# Patient Record
Sex: Female | Born: 1965 | Race: Black or African American | Hispanic: No | Marital: Single | Smoking: Never smoker
Health system: Southern US, Community
[De-identification: ages and names within clinical notes are randomized; demographics above are authoritative.]

## PROBLEM LIST (undated history)

## (undated) DIAGNOSIS — J302 Other seasonal allergic rhinitis: Secondary | ICD-10-CM

## (undated) DIAGNOSIS — E7800 Pure hypercholesterolemia, unspecified: Secondary | ICD-10-CM

## (undated) HISTORY — PX: HERNIA REPAIR: SHX51

---

## 2001-03-02 ENCOUNTER — Emergency Department (HOSPITAL_COMMUNITY): Admission: EM | Admit: 2001-03-02 | Discharge: 2001-03-03 | Payer: Self-pay | Admitting: *Deleted

## 2002-04-21 ENCOUNTER — Emergency Department (HOSPITAL_COMMUNITY): Admission: EM | Admit: 2002-04-21 | Discharge: 2002-04-21 | Payer: Self-pay | Admitting: Emergency Medicine

## 2002-10-13 ENCOUNTER — Emergency Department (HOSPITAL_COMMUNITY): Admission: EM | Admit: 2002-10-13 | Discharge: 2002-10-13 | Payer: Self-pay | Admitting: *Deleted

## 2005-04-28 ENCOUNTER — Emergency Department (HOSPITAL_COMMUNITY): Admission: EM | Admit: 2005-04-28 | Discharge: 2005-04-28 | Payer: Self-pay | Admitting: Emergency Medicine

## 2005-05-05 ENCOUNTER — Emergency Department (HOSPITAL_COMMUNITY): Admission: EM | Admit: 2005-05-05 | Discharge: 2005-05-05 | Payer: Self-pay | Admitting: Emergency Medicine

## 2005-07-19 ENCOUNTER — Emergency Department (HOSPITAL_COMMUNITY): Admission: EM | Admit: 2005-07-19 | Discharge: 2005-07-19 | Payer: Self-pay | Admitting: *Deleted

## 2006-05-05 ENCOUNTER — Emergency Department (HOSPITAL_COMMUNITY): Admission: EM | Admit: 2006-05-05 | Discharge: 2006-05-05 | Payer: Self-pay | Admitting: Emergency Medicine

## 2008-09-14 ENCOUNTER — Emergency Department (HOSPITAL_COMMUNITY): Admission: EM | Admit: 2008-09-14 | Discharge: 2008-09-15 | Payer: Self-pay | Admitting: Emergency Medicine

## 2009-11-08 ENCOUNTER — Ambulatory Visit (HOSPITAL_COMMUNITY): Admission: RE | Admit: 2009-11-08 | Discharge: 2009-11-08 | Payer: Self-pay | Admitting: Family Medicine

## 2010-10-13 ENCOUNTER — Emergency Department (HOSPITAL_COMMUNITY)
Admission: EM | Admit: 2010-10-13 | Discharge: 2010-10-13 | Payer: Self-pay | Source: Home / Self Care | Admitting: Emergency Medicine

## 2010-11-11 ENCOUNTER — Other Ambulatory Visit: Payer: Self-pay

## 2010-11-11 DIAGNOSIS — Z139 Encounter for screening, unspecified: Secondary | ICD-10-CM

## 2010-11-14 ENCOUNTER — Ambulatory Visit (HOSPITAL_COMMUNITY): Payer: Self-pay

## 2010-11-18 ENCOUNTER — Encounter (HOSPITAL_COMMUNITY): Payer: Self-pay

## 2010-11-22 ENCOUNTER — Ambulatory Visit (HOSPITAL_COMMUNITY)
Admission: RE | Admit: 2010-11-22 | Discharge: 2010-11-22 | Disposition: A | Discharge status: Home / Self Care | Payer: Self-pay | Source: Ambulatory Visit

## 2010-11-22 DIAGNOSIS — Z139 Encounter for screening, unspecified: Secondary | ICD-10-CM

## 2010-11-26 ENCOUNTER — Other Ambulatory Visit: Payer: Self-pay | Admitting: *Deleted

## 2010-11-26 DIAGNOSIS — R928 Other abnormal and inconclusive findings on diagnostic imaging of breast: Secondary | ICD-10-CM

## 2010-12-25 ENCOUNTER — Other Ambulatory Visit (HOSPITAL_COMMUNITY): Payer: Self-pay | Admitting: Family Medicine

## 2010-12-25 ENCOUNTER — Ambulatory Visit (HOSPITAL_COMMUNITY)
Admission: RE | Admit: 2010-12-25 | Discharge: 2010-12-25 | Disposition: A | Discharge status: Home / Self Care | Payer: Self-pay | Source: Ambulatory Visit | Attending: *Deleted | Admitting: *Deleted

## 2010-12-25 DIAGNOSIS — R928 Other abnormal and inconclusive findings on diagnostic imaging of breast: Secondary | ICD-10-CM

## 2011-10-24 ENCOUNTER — Encounter (HOSPITAL_COMMUNITY): Payer: Self-pay

## 2011-10-24 ENCOUNTER — Emergency Department (HOSPITAL_COMMUNITY)
Admission: EM | Admit: 2011-10-24 | Discharge: 2011-10-24 | Disposition: A | Discharge status: Home / Self Care | Payer: Self-pay | Attending: Emergency Medicine | Admitting: Emergency Medicine

## 2011-10-24 DIAGNOSIS — H9209 Otalgia, unspecified ear: Secondary | ICD-10-CM | POA: Insufficient documentation

## 2011-10-24 DIAGNOSIS — H6120 Impacted cerumen, unspecified ear: Secondary | ICD-10-CM | POA: Insufficient documentation

## 2011-10-24 DIAGNOSIS — H6690 Otitis media, unspecified, unspecified ear: Secondary | ICD-10-CM | POA: Insufficient documentation

## 2011-10-24 MED ORDER — AMOXICILLIN 500 MG PO CAPS: 500.0000 mg | ORAL_CAPSULE | Freq: Three times a day (TID) | ORAL | Status: AC

## 2011-10-24 MED ORDER — ANTIPYRINE-BENZOCAINE 5.4-1.4 % OT SOLN
3.0000 [drp] | Freq: Once | OTIC | Status: AC
  Administered 2011-10-24: 3 [drp] via OTIC
  Filled 2011-10-24: qty 10

## 2011-10-24 NOTE — ED Notes (Signed)
Pt a/ox4. Resp even and unlabored. NAD at this time. D/C instructions reviewed with pt. Pt verbalized understanding. Pt ambulated to lobby with steady gate.  

## 2011-10-24 NOTE — ED Notes (Signed)
Bilateral ear irrigation performed with warm saline. Large amount of wax removed from both ears.

## 2011-10-24 NOTE — ED Notes (Signed)
Pt presents with bilateral ear pain "deep down in the ear" since last night. No drainage or swelling noted. Pt has no hearing deficit. NAD at this time.

## 2011-10-24 NOTE — ED Provider Notes (Signed)
History     CSN: [id number]  Arrival date & time 10/24/11  1243   First MD Initiated Contact with Patient 10/24/11 1302      Chief Complaint  Patient presents with  . Otalgia    (Consider location/radiation/quality/duration/timing/severity/associated sxs/prior treatment) Patient is a 46 y.o. female presenting with ear pain. The history is provided by the patient. No language interpreter was used.  Otalgia This is a new problem. The current episode started yesterday. There is pain in both ears. The problem occurs constantly. The problem has not changed since onset.There has been no fever. The pain is mild. Pertinent negatives include no ear discharge, no headaches, no hearing loss, no rhinorrhea, no sore throat, no vomiting, no neck pain, no cough and no rash.    History reviewed. No pertinent past medical history.  History reviewed. No pertinent past surgical history.  No family history on file.  History  Substance Use Topics  . Smoking status: Never Smoker   . Smokeless tobacco: Not on file  . Alcohol Use: No    OB History    Grav Para Term Preterm Abortions TAB SAB Ect Mult Living                  Review of Systems  HENT: Positive for ear pain. Negative for hearing loss, congestion, sore throat, facial swelling, rhinorrhea, neck pain, tinnitus and ear discharge.   Eyes: Negative for visual disturbance.  Respiratory: Negative for cough.   Gastrointestinal: Negative for vomiting.  Skin: Negative for rash.  Neurological: Negative for dizziness, light-headedness and headaches.  All other systems reviewed and are negative.    Allergies  Review of patient's allergies indicates no known allergies.  Home Medications   Current Outpatient Rx  Name Route Sig Dispense Refill  . CETIRIZINE HCL 10 MG PO TABS Oral Take 10 mg by mouth daily.    Marland Kitchen FERROUS SULFATE 325 (65 FE) MG PO TABS Oral Take 325 mg by mouth daily.    . IBUPROFEN 200 MG PO TABS Oral Take 400 mg by mouth  every 6 (six) hours as needed. For pain    . OMEGA-3-ACID ETHYL ESTERS 1 G PO CAPS Oral Take 1 g by mouth 3 (three) times daily.      BP 124/77  Pulse 73  Temp(Src) 97.9 F (36.6 C) (Oral)  Resp 14  Ht 5\' 5"  (1.651 m)  Wt 177 lb (80.287 kg)  BMI 29.45 kg/m2  SpO2 100%  LMP 10/24/2011  Physical Exam  Nursing note and vitals reviewed. Constitutional: She is oriented to person, place, and time. She appears well-developed and well-nourished. No distress.  HENT:  Head: Normocephalic and atraumatic.  Right Ear: Tympanic membrane normal.  Left Ear: No drainage, swelling or tenderness. Tympanic membrane is erythematous.  Mouth/Throat: Uvula is midline, oropharynx is clear and moist and mucous membranes are normal.       Moderate amt of cerumen to bilateral ear canals. Portion of the TM visualized on left, with erythema of the left TM.  No perf  Neck: Normal range of motion. Neck supple. No thyromegaly present.  Cardiovascular: Normal rate, regular rhythm and normal heart sounds.   Pulmonary/Chest: Effort normal and breath sounds normal. No respiratory distress.  Musculoskeletal: Normal range of motion.  Lymphadenopathy:    She has no cervical adenopathy.  Neurological: She is alert and oriented to person, place, and time. No cranial nerve deficit. She exhibits normal muscle tone. Coordination normal.  Skin: Skin is warm and  dry.    ED Course  Procedures (including critical care time)       MDM     Moderate amt of cerumen to bilateral ears.  Left OM.  Cerumen irrigated from the left ear and moderate amt of cerumen removed from the right ear by me using an ear curette.  Pt tolerated well.    Pt feels improved after observation and/or treatment in ED.     Medical screening examination/treatment/procedure(s) were performed by non-physician practitioner and as supervising physician I was immediately available for consultation/collaboration. Osvaldo Human, M.D.    Tammy  L. [place], [place] 10/26/11 2251  Carleene Cooper III, MD 10/29/11 [phone]

## 2011-10-24 NOTE — ED Notes (Signed)
C/o bil ear pain since yesterday. nad noted. Pt a/o x3. Ambulated to triage without difficulty.

## 2012-04-13 ENCOUNTER — Other Ambulatory Visit (HOSPITAL_COMMUNITY): Payer: Self-pay | Admitting: Nurse Practitioner

## 2012-04-13 DIAGNOSIS — Z139 Encounter for screening, unspecified: Secondary | ICD-10-CM

## 2012-04-20 ENCOUNTER — Ambulatory Visit (HOSPITAL_COMMUNITY)
Admission: RE | Admit: 2012-04-20 | Discharge: 2012-04-20 | Disposition: A | Discharge status: Home / Self Care | Payer: PRIVATE HEALTH INSURANCE | Source: Ambulatory Visit | Attending: Nurse Practitioner | Admitting: Nurse Practitioner

## 2012-04-20 DIAGNOSIS — Z139 Encounter for screening, unspecified: Secondary | ICD-10-CM

## 2012-04-20 DIAGNOSIS — Z1231 Encounter for screening mammogram for malignant neoplasm of breast: Secondary | ICD-10-CM | POA: Insufficient documentation

## 2012-04-26 ENCOUNTER — Other Ambulatory Visit (HOSPITAL_COMMUNITY): Payer: Self-pay | Admitting: Nurse Practitioner

## 2012-04-26 DIAGNOSIS — IMO0002 Reserved for concepts with insufficient information to code with codable children: Secondary | ICD-10-CM

## 2012-04-28 ENCOUNTER — Encounter (HOSPITAL_COMMUNITY): Payer: Self-pay

## 2012-04-28 ENCOUNTER — Ambulatory Visit (HOSPITAL_COMMUNITY)
Admission: RE | Admit: 2012-04-28 | Discharge: 2012-04-28 | Disposition: A | Discharge status: Home / Self Care | Payer: PRIVATE HEALTH INSURANCE | Source: Ambulatory Visit | Attending: Nurse Practitioner | Admitting: Nurse Practitioner

## 2012-04-28 DIAGNOSIS — IMO0002 Reserved for concepts with insufficient information to code with codable children: Secondary | ICD-10-CM

## 2012-04-28 DIAGNOSIS — N63 Unspecified lump in unspecified breast: Secondary | ICD-10-CM | POA: Insufficient documentation

## 2012-04-28 DIAGNOSIS — R928 Other abnormal and inconclusive findings on diagnostic imaging of breast: Secondary | ICD-10-CM | POA: Insufficient documentation

## 2013-02-01 ENCOUNTER — Other Ambulatory Visit (HOSPITAL_COMMUNITY): Payer: Self-pay | Admitting: *Deleted

## 2013-02-01 DIAGNOSIS — D492 Neoplasm of unspecified behavior of bone, soft tissue, and skin: Secondary | ICD-10-CM

## 2013-02-03 ENCOUNTER — Ambulatory Visit (HOSPITAL_COMMUNITY): Payer: Self-pay

## 2013-02-03 ENCOUNTER — Other Ambulatory Visit (HOSPITAL_COMMUNITY): Payer: Self-pay | Admitting: *Deleted

## 2013-02-03 ENCOUNTER — Ambulatory Visit (HOSPITAL_COMMUNITY)
Admission: RE | Admit: 2013-02-03 | Discharge: 2013-02-03 | Disposition: A | Discharge status: Home / Self Care | Payer: Self-pay | Source: Ambulatory Visit | Attending: *Deleted | Admitting: *Deleted

## 2013-02-03 DIAGNOSIS — K429 Umbilical hernia without obstruction or gangrene: Secondary | ICD-10-CM | POA: Insufficient documentation

## 2013-02-03 DIAGNOSIS — D492 Neoplasm of unspecified behavior of bone, soft tissue, and skin: Secondary | ICD-10-CM

## 2014-01-18 ENCOUNTER — Other Ambulatory Visit: Payer: Self-pay | Admitting: Obstetrics and Gynecology

## 2014-01-18 ENCOUNTER — Encounter: Payer: Self-pay | Admitting: Obstetrics and Gynecology

## 2014-01-18 ENCOUNTER — Ambulatory Visit (INDEPENDENT_AMBULATORY_CARE_PROVIDER_SITE_OTHER): Payer: BC Managed Care – PPO | Admitting: Obstetrics and Gynecology

## 2014-01-18 ENCOUNTER — Other Ambulatory Visit (HOSPITAL_COMMUNITY)
Admission: RE | Admit: 2014-01-18 | Discharge: 2014-01-18 | Disposition: A | Discharge status: Home / Self Care | Payer: BC Managed Care – PPO | Source: Ambulatory Visit | Attending: Obstetrics and Gynecology | Admitting: Obstetrics and Gynecology

## 2014-01-18 VITALS — BP 130/86 | Ht 65.0 in | Wt 184.0 lb

## 2014-01-18 DIAGNOSIS — Z01419 Encounter for gynecological examination (general) (routine) without abnormal findings: Secondary | ICD-10-CM | POA: Insufficient documentation

## 2014-01-18 DIAGNOSIS — Z0000 Encounter for general adult medical examination without abnormal findings: Secondary | ICD-10-CM

## 2014-01-18 DIAGNOSIS — Z1212 Encounter for screening for malignant neoplasm of rectum: Secondary | ICD-10-CM

## 2014-01-18 DIAGNOSIS — Z124 Encounter for screening for malignant neoplasm of cervix: Secondary | ICD-10-CM

## 2014-01-18 DIAGNOSIS — Z1151 Encounter for screening for human papillomavirus (HPV): Secondary | ICD-10-CM | POA: Insufficient documentation

## 2014-01-18 LAB — HEMOCCULT GUIAC POC 1CARD (OFFICE): Fecal Occult Blood, POC: NEGATIVE

## 2014-01-18 MED ORDER — DESLORATADINE 5 MG PO TABS: 5.0000 mg | ORAL_TABLET | Freq: Every day | ORAL | Status: DC

## 2014-01-18 NOTE — Addendum Note (Signed)
Addended by: Jonnie Kind on: 01/18/2014 03:12 PM   Modules accepted: Orders

## 2014-01-18 NOTE — Patient Instructions (Signed)
Return sometime after 9 AM for blood work at [facility]. Please don't eat beforehand.  Labs to be drawn:    CBC, CMP, HIV, HEPBsAg, RPR, Lipid panel, tsh,

## 2014-01-18 NOTE — Progress Notes (Signed)
This chart was scribed by Jenne Campus, Medical Scribe, for Dr. Mallory Shirk on 01/18/14 at 2:24 PM. This chart was reviewed by Dr. Mallory Shirk and is accurate.  Assessment:  Annual Gyn Exam   Plan:  1. pap smear done with STD testing, next pap due 3 years 2. return annually or prn 3    Annual mammogram advised 4.  Hemoccult card every year encouraged  5.  Loratadine 10 mg refill  altrernative rx'd 6.  Return for blood work at [employer] convenience  Subjective:  Leah Lucas is a 48 y.o. female G1P1, vaginal delivery with perineal tear 23 years ago. who presents for annual exam. Patient's last menstrual period was 01/07/2014. The patient has complaints today of ?enlarged tonsils noted during a visit last week. Recent sexual partner without protection. Denies any STD sxs but would like to be tested.   The following portions of the patient's history were reviewed and updated as appropriate: allergies, current medications, past family history, past medical history, past social history, past surgical history and problem list.  Review of Systems Constitutional: negative Gastrointestinal: negative Genitourinary: negative   Objective:  BP 130/86  Ht 5\' 5"  (1.651 m)  Wt 184 lb (83.462 kg)  BMI 30.62 kg/m2  LMP 01/07/2014   BMI: Body mass index is 30.62 kg/(m^2).  Chaperone present for exam which was performed with pt's permission General Appearance: Alert, appropriate appearance for age. No acute distress HEENT: Grossly normal, tonsils are normal Neck / Thyroid:  Cardiovascular: RRR; normal S1, S2, no murmur Lungs: CTA bilaterally Back: No CVAT Breast Exam: No dimpling, nipple retraction or discharge. No masses or nodes., Normal to inspection, Normal breast tissue bilaterally and No masses or nodes.No dimpling, nipple retraction or discharge. Gastrointestinal: Soft, non-tender, no masses or organomegaly Pelvic Exam: External genitalia: normal general appearance Urinary system:  urethral meatus normal Vaginal: normal mucosa without prolapse or lesions Cervix: normal appearance and thin prep PAP obtained Adnexa: large 5 cm fibroid in left adnexa, non-tender Uterus: anterior, irregular with fibroid enlargement to 8 week size Rectal: good sphincter tone and no masses, guaiac negative  Lymphatic Exam: Non-palpable nodes in neck, clavicular, axillary, or inguinal regions  Skin: no rash or abnormalities Neurologic: Normal gait and speech, no tremor  Psychiatric: Alert and oriented, appropriate affect.  Urinalysis:Not done refil desloratidine Mallory Shirk. MD Pgr [phone] 2:39 PM

## 2014-01-19 ENCOUNTER — Other Ambulatory Visit: Payer: BC Managed Care – PPO

## 2014-01-19 DIAGNOSIS — Z1329 Encounter for screening for other suspected endocrine disorder: Secondary | ICD-10-CM

## 2014-01-19 DIAGNOSIS — Z0000 Encounter for general adult medical examination without abnormal findings: Secondary | ICD-10-CM

## 2014-01-19 LAB — CBC
HCT: 38.6 % (ref 36.0–46.0)
Hemoglobin: 13.3 g/dL (ref 12.0–15.0)
MCH: 26.7 pg (ref 26.0–34.0)
MCHC: 34.5 g/dL (ref 30.0–36.0)
MCV: 77.5 fL — AB (ref 78.0–100.0)
PLATELETS: 396 10*3/uL (ref 150–400)
RBC: 4.98 MIL/uL (ref 3.87–5.11)
RDW: 14.3 % (ref 11.5–15.5)
WBC: 5.2 10*3/uL (ref 4.0–10.5)

## 2014-01-19 LAB — TSH: TSH: 1.618 u[IU]/mL (ref 0.350–4.500)

## 2014-01-19 LAB — LIPID PANEL
CHOLESTEROL: 232 mg/dL — AB (ref 0–200)
HDL: 56 mg/dL (ref >39)
LDL Cholesterol: 165 mg/dL — ABNORMAL HIGH (ref 0–99)
TRIGLYCERIDES: 56 mg/dL (ref <150)
Total CHOL/HDL Ratio: 4.1 Ratio
VLDL: 11 mg/dL (ref 0–40)

## 2014-01-19 LAB — COMPREHENSIVE METABOLIC PANEL
ALT: <8 U/L (ref 0–35)
AST: 10 U/L (ref 0–37)
Albumin: 4.1 g/dL (ref 3.5–5.2)
Alkaline Phosphatase: 64 U/L (ref 39–117)
BILIRUBIN TOTAL: 0.8 mg/dL (ref 0.2–1.2)
BUN: 6 mg/dL (ref 6–23)
CO2: 29 mEq/L (ref 19–32)
CREATININE: 0.69 mg/dL (ref 0.50–1.10)
Calcium: 9.5 mg/dL (ref 8.4–10.5)
Chloride: 102 mEq/L (ref 96–112)
GLUCOSE: 98 mg/dL (ref 70–99)
Potassium: 4.4 mEq/L (ref 3.5–5.3)
SODIUM: 137 meq/L (ref 135–145)
TOTAL PROTEIN: 7.3 g/dL (ref 6.0–8.3)

## 2014-01-19 LAB — RPR

## 2014-01-20 LAB — HSV 2 ANTIBODY, IGG: HSV 2 Glycoprotein G Ab, IgG: 0.14 IV

## 2014-01-20 LAB — HEPATITIS B SURFACE ANTIGEN: HEP B S AG: NEGATIVE

## 2014-01-23 ENCOUNTER — Telehealth: Payer: Self-pay | Admitting: Obstetrics and Gynecology

## 2014-01-23 NOTE — Telephone Encounter (Signed)
Pt informed of elevated cholesterol and LDL results, encouraged to diet and exercise.

## 2014-01-24 ENCOUNTER — Ambulatory Visit: Payer: BC Managed Care – PPO

## 2014-01-24 NOTE — Telephone Encounter (Signed)
Pt aware of labs, to pick up copy. STi tests negative,  Cholesterol TC elevated 230's. With good HDL. Pt aware.

## 2014-01-25 ENCOUNTER — Ambulatory Visit (INDEPENDENT_AMBULATORY_CARE_PROVIDER_SITE_OTHER): Payer: BC Managed Care – PPO | Admitting: Obstetrics and Gynecology

## 2014-01-25 ENCOUNTER — Other Ambulatory Visit: Payer: BC Managed Care – PPO

## 2014-01-25 DIAGNOSIS — Z113 Encounter for screening for infections with a predominantly sexual mode of transmission: Secondary | ICD-10-CM

## 2014-01-25 NOTE — Progress Notes (Signed)
Pt here for GC/CHL urine only. Pt informed she can call office tomorrow after lunch for results.

## 2014-01-26 LAB — GC/CHLAMYDIA PROBE AMP
CT Probe RNA: NEGATIVE
GC Probe RNA: NEGATIVE

## 2014-01-26 NOTE — Telephone Encounter (Signed)
I have spoken to the patient regarding diet, exercise also, and advised pt to get a copy of labs or sign up for my chart for access to results.

## 2014-02-06 NOTE — Progress Notes (Signed)
Apparent no show.

## 2014-02-13 ENCOUNTER — Other Ambulatory Visit: Payer: Self-pay | Admitting: Obstetrics and Gynecology

## 2014-02-13 DIAGNOSIS — D259 Leiomyoma of uterus, unspecified: Secondary | ICD-10-CM

## 2014-02-15 ENCOUNTER — Ambulatory Visit: Payer: BC Managed Care – PPO | Admitting: Obstetrics and Gynecology

## 2014-02-15 ENCOUNTER — Other Ambulatory Visit: Payer: BC Managed Care – PPO

## 2014-02-22 ENCOUNTER — Ambulatory Visit: Payer: BC Managed Care – PPO | Admitting: Obstetrics and Gynecology

## 2014-02-22 ENCOUNTER — Other Ambulatory Visit: Payer: BC Managed Care – PPO

## 2014-03-02 ENCOUNTER — Ambulatory Visit: Payer: BC Managed Care – PPO | Admitting: Obstetrics and Gynecology

## 2014-03-02 ENCOUNTER — Other Ambulatory Visit: Payer: BC Managed Care – PPO

## 2014-04-24 ENCOUNTER — Telehealth: Payer: Self-pay | Admitting: Obstetrics and Gynecology

## 2014-04-24 NOTE — Telephone Encounter (Signed)
Pt states that she was supposed to have a [place] but it had to be rescheduled. Pt states she had a boo boo and she is not on BC at all. Pt states that she had unprotected sex today. How long after this is there a chance for her to get pregnant? Next period should start around 74 or the 22nd of this month.   I spoke with JVF and he advised that the pt should get Plan B and take that just in case.  I advised the pt of this and she verbalized understanding.

## 2014-05-08 ENCOUNTER — Other Ambulatory Visit: Payer: BC Managed Care – PPO

## 2014-05-08 DIAGNOSIS — Z3202 Encounter for pregnancy test, result negative: Secondary | ICD-10-CM

## 2014-05-09 ENCOUNTER — Telehealth: Payer: Self-pay | Admitting: Adult Health

## 2014-05-09 LAB — HCG, QUANTITATIVE, PREGNANCY: hCG, Beta Chain, Quant, S: <2 m[IU]/mL

## 2014-05-09 NOTE — Telephone Encounter (Signed)
Pt informed of negative blood pregnancy test.

## 2014-05-18 ENCOUNTER — Encounter: Payer: Self-pay | Admitting: Obstetrics and Gynecology

## 2014-05-18 ENCOUNTER — Ambulatory Visit (INDEPENDENT_AMBULATORY_CARE_PROVIDER_SITE_OTHER): Payer: Self-pay | Admitting: Obstetrics and Gynecology

## 2014-05-18 VITALS — BP 120/72 | Ht 65.0 in | Wt 183.0 lb

## 2014-05-18 DIAGNOSIS — Z113 Encounter for screening for infections with a predominantly sexual mode of transmission: Secondary | ICD-10-CM

## 2014-05-18 DIAGNOSIS — N926 Irregular menstruation, unspecified: Secondary | ICD-10-CM

## 2014-05-18 DIAGNOSIS — N912 Amenorrhea, unspecified: Secondary | ICD-10-CM | POA: Insufficient documentation

## 2014-05-18 MED ORDER — MEDROXYPROGESTERONE ACETATE 10 MG PO TABS: 10.0000 mg | ORAL_TABLET | Freq: Every day | ORAL | Status: DC

## 2014-05-18 NOTE — Patient Instructions (Signed)
Endometrial Ablation Endometrial ablation removes the lining of the uterus (endometrium). It is usually a same-day, outpatient treatment. Ablation helps avoid major surgery, such as surgery to remove the cervix and uterus (hysterectomy). After endometrial ablation, you will have little or no menstrual bleeding and may not be able to have children. However, if you are premenopausal, you will need to use a reliable method of birth control following the procedure because of the small chance that pregnancy can occur. There are different reasons to have this procedure, which include:  Heavy periods.  Bleeding that is causing anemia.  Irregular bleeding.  Bleeding fibroids on the lining inside the uterus if they are smaller than 3 centimeters. This procedure should not be done if:  You want children in the future.  You have severe cramps with your menstrual period.  You have precancerous or cancerous cells in your uterus.  You were recently pregnant.  You have gone through menopause.  You have had major surgery on the uterus, such as a cesarean delivery. LET YOUR HEALTH CARE PROVIDER KNOW ABOUT:  Any allergies you have.  All medicines you are taking, including vitamins, herbs, eye drops, creams, and over-the-counter medicines.  Previous problems you or members of your family have had with the use of anesthetics.  Any blood disorders you have.  Previous surgeries you have had.  Medical conditions you have. RISKS AND COMPLICATIONS  Generally, this is a safe procedure. However, as with any procedure, complications can occur. Possible complications include:  Perforation of the uterus.  Bleeding.  Infection of the uterus, bladder, or vagina.  Injury to surrounding organs.  An air bubble to the lung (air embolus).  Pregnancy following the procedure.  Failure of the procedure to help the problem, requiring hysterectomy.  Decreased ability to diagnose cancer in the lining of  the uterus. BEFORE THE PROCEDURE  The lining of the uterus must be tested to make sure there is no pre-cancerous or cancer cells present.  An ultrasound may be performed to look at the size of the uterus and to check for abnormalities.  Medicines may be given to thin the lining of the uterus. PROCEDURE  During the procedure, your health care provider will use a tool called a resectoscope to help see inside your uterus. There are different ways to remove the lining of your uterus.   Radiofrequency - This method uses a radiofrequency-alternating electric current to remove the lining of the uterus.  Cryotherapy - This method uses extreme cold to freeze the lining of the uterus.  Heated-Free Liquid - This method uses heated salt (saline) solution to remove the lining of the uterus.  Microwave - This method uses high-energy microwaves to heat up the lining of the uterus to remove it.  Thermal balloon - This method involves inserting a catheter with a balloon tip into the uterus. The balloon tip is filled with heated fluid to remove the lining of the uterus. AFTER THE PROCEDURE  After your procedure, do not have sexual intercourse or insert anything into your vagina until permitted by your health care provider. After the procedure, you may experience:  Cramps.  Vaginal discharge.  Frequent urination. Document Released: 08/01/2004 Document Revised: 05/25/2013 Document Reviewed: 02/23/2013 ExitCare Patient Information 2015 [employer]. This information is not intended to replace advice given to you by your health care provider. Make sure you discuss any questions you have with your health care provider.  

## 2014-05-18 NOTE — Progress Notes (Signed)
Patient ID: Leah Lucas, female   DOB: [birth year], 48 y.o.   MRN: [id number]  This chart was scribed by Erling Conte, Medical Scribe, for Dr. Mallory Shirk on 05/18/14 at 9:38 AM. This chart was reviewed by Dr. Mallory Shirk for accuracy.     [facility] Visit  Patient name: Leah Lucas MRN [id number]  Date of birth: 07-04-1966  CC & HPI:  Leah Lucas is a 48 y.o. female presenting today for missing her period. Pt states that she has not had a period since 03/26/14. Pt states that she has had a UPT and a QHCG and both were negative. Pt worried that she may be pregnant, but is not sure what is going on or why she hasn't had her period. Pt states she is sexually active and does not use any form of birth control  ROS:  + irregular periods No other complaints  Pertinent History Reviewed:   Reviewed: Significant for Medical        History reviewed. No pertinent past medical history.                            Surgical Hx:   History reviewed. No pertinent past surgical history. Medications: Reviewed & Updated - see associated section                      Current outpatient prescriptions:desloratadine (CLARINEX) 5 MG tablet, Take 1 tablet (5 mg total) by mouth daily., Disp: 30 tablet, Rfl: 6;  ibuprofen (ADVIL,MOTRIN) 200 MG tablet, Take 400 mg by mouth every 6 (six) hours as needed. For pain, Disp: , Rfl: ;  omega-3 acid ethyl esters (LOVAZA) 1 G capsule, Take 1 g by mouth 3 (three) times daily., Disp: , Rfl: ;  ferrous sulfate 325 (65 FE) MG tablet, Take 325 mg by mouth daily., Disp: , Rfl:  [DISCONTINUED] loratadine (CLARITIN) 10 MG tablet, Take 10 mg by mouth daily., Disp: , Rfl:    Social History: Reviewed -  reports that she has never smoked. She has never used smokeless tobacco.  Son Tyrell is QB applying for job in [employer]. Objective Findings:  Vitals: Blood pressure 120/72, height 5\' 5"  (1.651 m), weight 183 lb (83.008 kg), last menstrual period  03/26/2014.  Physical Examination: General appearance - alert, well appearing, and in no distress Abdomen - soft, nontender, nondistended, no masses or organomegaly Breasts - breasts appear normal, no suspicious masses, no skin or nipple changes or axillary nodes Pelvic - normal external genitalia, vulva, vagina, cervix, uterus and adnexa.  Cervical mucus  Negative for ferning. Assessment & Plan:   A: amenorrhea, likely anovulatory cycle     New sex partner x 2 mos.     Unprotected sex x 2 P :        Provera 10 mg x 10 d to w/drawal bleed   Recheck Gc/Chl.     Return in 2 wk to discuss Tubal sterilization.   Given info  On  BTL/ VAS/ Endometrial Ablation/

## 2014-05-19 LAB — GC/CHLAMYDIA PROBE AMP
CT Probe RNA: NEGATIVE
GC PROBE AMP APTIMA: NEGATIVE

## 2014-05-19 LAB — HIV ANTIBODY (ROUTINE TESTING W REFLEX): HIV 1&2 Ab, 4th Generation: NONREACTIVE

## 2014-05-24 ENCOUNTER — Telehealth: Payer: Self-pay | Admitting: Obstetrics and Gynecology

## 2014-05-24 NOTE — Telephone Encounter (Signed)
Pt came by office and results were given to the pt.

## 2014-06-02 ENCOUNTER — Encounter: Payer: BC Managed Care – PPO | Admitting: Obstetrics and Gynecology

## 2014-07-17 ENCOUNTER — Ambulatory Visit: Payer: BC Managed Care – PPO | Admitting: Obstetrics and Gynecology

## 2014-07-17 ENCOUNTER — Telehealth: Payer: Self-pay | Admitting: *Deleted

## 2014-07-17 ENCOUNTER — Telehealth: Payer: Self-pay | Admitting: Obstetrics and Gynecology

## 2014-07-17 NOTE — Telephone Encounter (Signed)
Pt called stating that she was seen in July and had talked with Dr. Glo Herring about her periods and [facility]. Pt states that she had a regular period in August butis still bleeding from her period in September. Pt wanted to talk about getting put on [facility] and the bleeding. I advised the pt that she would have to make an appointment to see Dr. Glo Herring. Pt wanted to take care of everything over the phone but the call was switched up front and the pt was given an appointment for today with D.r Glo Herring.

## 2014-07-25 MED ORDER — MEGESTROL ACETATE 40 MG PO TABS: 40.0000 mg | ORAL_TABLET | Freq: Three times a day (TID) | ORAL | Status: DC

## 2014-07-25 MED ORDER — NORETHIN ACE-ETH ESTRAD-FE 1-20 MG-MCG PO TABS: 1.0000 | ORAL_TABLET | Freq: Every day | ORAL | Status: DC

## 2014-07-25 NOTE — Telephone Encounter (Signed)
Pt had menses in July , normal. Then in September  25, still spotting , flowing thru clothes qod Will Rx Megace 40 tid til bleeding stops. Has negative preg test. Plan: to begin Junel after bleeding controlled. Will escribe rx

## 2014-07-26 ENCOUNTER — Other Ambulatory Visit: Payer: Self-pay | Admitting: Obstetrics and Gynecology

## 2014-07-26 ENCOUNTER — Other Ambulatory Visit (HOSPITAL_COMMUNITY): Payer: Self-pay | Admitting: Nurse Practitioner

## 2014-07-26 DIAGNOSIS — N920 Excessive and frequent menstruation with regular cycle: Secondary | ICD-10-CM

## 2014-07-26 DIAGNOSIS — N852 Hypertrophy of uterus: Secondary | ICD-10-CM

## 2014-07-26 DIAGNOSIS — D259 Leiomyoma of uterus, unspecified: Secondary | ICD-10-CM

## 2014-07-26 DIAGNOSIS — N924 Excessive bleeding in the premenopausal period: Secondary | ICD-10-CM

## 2014-07-26 DIAGNOSIS — N923 Ovulation bleeding: Secondary | ICD-10-CM

## 2014-07-27 ENCOUNTER — Ambulatory Visit (HOSPITAL_COMMUNITY): Admission: RE | Admit: 2014-07-27 | Payer: BC Managed Care – PPO | Source: Ambulatory Visit

## 2014-07-27 ENCOUNTER — Ambulatory Visit (HOSPITAL_COMMUNITY): Payer: BC Managed Care – PPO

## 2014-07-28 ENCOUNTER — Other Ambulatory Visit (HOSPITAL_COMMUNITY): Payer: Self-pay | Admitting: Nurse Practitioner

## 2014-07-28 DIAGNOSIS — N920 Excessive and frequent menstruation with regular cycle: Secondary | ICD-10-CM

## 2014-07-28 DIAGNOSIS — N924 Excessive bleeding in the premenopausal period: Secondary | ICD-10-CM

## 2014-07-28 DIAGNOSIS — N852 Hypertrophy of uterus: Secondary | ICD-10-CM

## 2014-07-28 DIAGNOSIS — N923 Ovulation bleeding: Secondary | ICD-10-CM

## 2014-07-31 ENCOUNTER — Ambulatory Visit (HOSPITAL_COMMUNITY): Admission: RE | Admit: 2014-07-31 | Payer: Self-pay | Source: Ambulatory Visit

## 2014-07-31 ENCOUNTER — Ambulatory Visit (HOSPITAL_COMMUNITY): Payer: Self-pay

## 2014-07-31 ENCOUNTER — Ambulatory Visit (HOSPITAL_COMMUNITY)
Admission: RE | Admit: 2014-07-31 | Discharge: 2014-07-31 | Disposition: A | Discharge status: Home / Self Care | Payer: BC Managed Care – PPO | Source: Ambulatory Visit | Attending: Nurse Practitioner | Admitting: Nurse Practitioner

## 2014-07-31 ENCOUNTER — Ambulatory Visit (HOSPITAL_COMMUNITY): Payer: BC Managed Care – PPO

## 2014-07-31 DIAGNOSIS — N923 Ovulation bleeding: Secondary | ICD-10-CM

## 2014-07-31 DIAGNOSIS — N852 Hypertrophy of uterus: Secondary | ICD-10-CM

## 2014-07-31 DIAGNOSIS — N920 Excessive and frequent menstruation with regular cycle: Secondary | ICD-10-CM | POA: Insufficient documentation

## 2014-07-31 DIAGNOSIS — N924 Excessive bleeding in the premenopausal period: Secondary | ICD-10-CM | POA: Insufficient documentation

## 2014-08-07 ENCOUNTER — Encounter: Payer: Self-pay | Admitting: Obstetrics and Gynecology

## 2014-08-17 ENCOUNTER — Other Ambulatory Visit (HOSPITAL_COMMUNITY): Payer: Self-pay | Admitting: Nurse Practitioner

## 2014-08-17 DIAGNOSIS — Z1231 Encounter for screening mammogram for malignant neoplasm of breast: Secondary | ICD-10-CM

## 2014-08-24 ENCOUNTER — Ambulatory Visit (HOSPITAL_COMMUNITY)
Admission: RE | Admit: 2014-08-24 | Discharge: 2014-08-24 | Disposition: A | Discharge status: Home / Self Care | Payer: Self-pay | Source: Ambulatory Visit | Attending: Nurse Practitioner | Admitting: Nurse Practitioner

## 2014-08-24 DIAGNOSIS — Z1231 Encounter for screening mammogram for malignant neoplasm of breast: Secondary | ICD-10-CM

## 2015-07-23 ENCOUNTER — Other Ambulatory Visit (HOSPITAL_COMMUNITY): Payer: Self-pay | Admitting: Nurse Practitioner

## 2015-07-23 DIAGNOSIS — Z1231 Encounter for screening mammogram for malignant neoplasm of breast: Secondary | ICD-10-CM

## 2015-08-27 ENCOUNTER — Ambulatory Visit (HOSPITAL_COMMUNITY)
Admission: RE | Admit: 2015-08-27 | Discharge: 2015-08-27 | Disposition: A | Discharge status: Home / Self Care | Payer: Self-pay | Source: Ambulatory Visit | Attending: Nurse Practitioner | Admitting: Nurse Practitioner

## 2015-08-27 DIAGNOSIS — Z1231 Encounter for screening mammogram for malignant neoplasm of breast: Secondary | ICD-10-CM

## 2016-09-25 ENCOUNTER — Other Ambulatory Visit (HOSPITAL_COMMUNITY): Payer: Self-pay | Admitting: Nurse Practitioner

## 2016-09-25 DIAGNOSIS — Z1231 Encounter for screening mammogram for malignant neoplasm of breast: Secondary | ICD-10-CM

## 2016-10-01 ENCOUNTER — Ambulatory Visit (HOSPITAL_COMMUNITY): Payer: Self-pay

## 2016-10-01 ENCOUNTER — Ambulatory Visit (HOSPITAL_COMMUNITY)
Admission: RE | Admit: 2016-10-01 | Discharge: 2016-10-01 | Disposition: A | Discharge status: Home / Self Care | Payer: Self-pay | Source: Ambulatory Visit | Attending: Nurse Practitioner | Admitting: Nurse Practitioner

## 2016-10-01 ENCOUNTER — Other Ambulatory Visit (HOSPITAL_COMMUNITY): Payer: Self-pay | Admitting: Nurse Practitioner

## 2016-10-01 DIAGNOSIS — Z1231 Encounter for screening mammogram for malignant neoplasm of breast: Secondary | ICD-10-CM

## 2017-09-23 ENCOUNTER — Other Ambulatory Visit (HOSPITAL_COMMUNITY): Payer: Self-pay | Admitting: *Deleted

## 2017-09-23 DIAGNOSIS — Z1231 Encounter for screening mammogram for malignant neoplasm of breast: Secondary | ICD-10-CM

## 2017-10-05 ENCOUNTER — Encounter (HOSPITAL_COMMUNITY): Payer: Self-pay

## 2017-10-05 ENCOUNTER — Ambulatory Visit (HOSPITAL_COMMUNITY)
Admission: RE | Admit: 2017-10-05 | Discharge: 2017-10-05 | Disposition: A | Discharge status: Home / Self Care | Payer: BLUE CROSS/BLUE SHIELD | Source: Ambulatory Visit | Attending: *Deleted | Admitting: *Deleted

## 2017-10-05 DIAGNOSIS — Z1231 Encounter for screening mammogram for malignant neoplasm of breast: Secondary | ICD-10-CM | POA: Diagnosis not present

## 2017-12-18 ENCOUNTER — Other Ambulatory Visit (HOSPITAL_COMMUNITY): Payer: Self-pay | Admitting: Internal Medicine

## 2017-12-18 DIAGNOSIS — K439 Ventral hernia without obstruction or gangrene: Secondary | ICD-10-CM

## 2017-12-24 ENCOUNTER — Ambulatory Visit (HOSPITAL_COMMUNITY)
Admission: RE | Admit: 2017-12-24 | Discharge: 2017-12-24 | Disposition: A | Discharge status: Home / Self Care | Payer: BLUE CROSS/BLUE SHIELD | Source: Ambulatory Visit | Attending: Internal Medicine | Admitting: Internal Medicine

## 2017-12-24 DIAGNOSIS — K439 Ventral hernia without obstruction or gangrene: Secondary | ICD-10-CM | POA: Diagnosis not present

## 2018-02-01 ENCOUNTER — Encounter: Payer: Self-pay | Admitting: Obstetrics & Gynecology

## 2018-02-19 ENCOUNTER — Encounter: Payer: Self-pay | Admitting: Obstetrics & Gynecology

## 2018-03-25 ENCOUNTER — Encounter: Payer: Self-pay | Admitting: Obstetrics & Gynecology

## 2018-04-29 ENCOUNTER — Ambulatory Visit: Payer: BLUE CROSS/BLUE SHIELD

## 2018-09-07 ENCOUNTER — Other Ambulatory Visit (HOSPITAL_COMMUNITY): Payer: Self-pay | Admitting: Internal Medicine

## 2018-09-07 DIAGNOSIS — Z1231 Encounter for screening mammogram for malignant neoplasm of breast: Secondary | ICD-10-CM

## 2018-10-08 ENCOUNTER — Ambulatory Visit (HOSPITAL_COMMUNITY)
Admission: RE | Admit: 2018-10-08 | Discharge: 2018-10-08 | Disposition: A | Discharge status: Home / Self Care | Payer: PRIVATE HEALTH INSURANCE | Source: Ambulatory Visit | Attending: Internal Medicine | Admitting: Internal Medicine

## 2018-10-08 ENCOUNTER — Encounter (HOSPITAL_COMMUNITY): Payer: Self-pay

## 2018-10-08 DIAGNOSIS — Z1231 Encounter for screening mammogram for malignant neoplasm of breast: Secondary | ICD-10-CM | POA: Diagnosis not present

## 2019-07-24 ENCOUNTER — Encounter (HOSPITAL_COMMUNITY): Payer: Self-pay

## 2019-07-24 ENCOUNTER — Ambulatory Visit (HOSPITAL_COMMUNITY)
Admission: EM | Admit: 2019-07-24 | Discharge: 2019-07-24 | Disposition: A | Discharge status: Home / Self Care | Payer: PRIVATE HEALTH INSURANCE

## 2019-07-24 DIAGNOSIS — R030 Elevated blood-pressure reading, without diagnosis of hypertension: Secondary | ICD-10-CM | POA: Diagnosis not present

## 2019-07-24 NOTE — ED Provider Notes (Signed)
[facility]    CSN: [id number] Arrival date & time: 07/24/19  1735      History   Chief Complaint Chief Complaint  Patient presents with  . Hypertension    HPI Leah Lucas is a 53 y.o. female.   Demisha Lahti presents with concerns about her blood pressure. [facility] to her foot, bp was noted to be 130/78 and then 151/89. Last evening bp of 139/91. Today noted bp of 140/82. No history of htn. She is concerned about these readings. No headache. No dizziness, no vision changes, no swelling, no chest pain . She takes pravastatin and is in her last month of phentermine. She does follow with a pcp.  No other cardiac history. Was prescribed antibiotics for her foot wound.    ROS per HPI, negative if not otherwise mentioned.      History reviewed. No pertinent past medical history.  Patient Active Problem List   Diagnosis Date Noted  . Amenorrhea 05/18/2014    History reviewed. No pertinent surgical history.  OB History    Gravida  1   Para  1   Term      Preterm      AB      Living        SAB      TAB      Ectopic      Multiple      Live Births               Home Medications    Prior to Admission medications   Medication Sig Start Date End Date Taking? Authorizing Provider  desloratadine (CLARINEX) 5 MG tablet Take 1 tablet (5 mg total) by mouth daily. 01/18/14   Jonnie Kind, MD  ferrous sulfate 325 (65 FE) MG tablet Take 325 mg by mouth daily.    [provider]  ibuprofen (ADVIL,MOTRIN) 200 MG tablet Take 400 mg by mouth every 6 (six) hours as needed. For pain    [provider]  medroxyPROGESTERone (PROVERA) 10 MG tablet Take 1 tablet (10 mg total) by mouth daily. 05/18/14   Jonnie Kind, MD  megestrol (MEGACE) 40 MG tablet Take 1 tablet (40 mg total) by mouth 3 (three) times daily. Until bleeding stops, the switch to birth control pills. 07/25/14    Jonnie Kind, MD  norethindrone-ethinyl estradiol (JUNEL FE 1/20) 1-20 MG-MCG tablet Take 1 tablet by mouth daily. 07/25/14   Jonnie Kind, MD  omega-3 acid ethyl esters (LOVAZA) 1 G capsule Take 1 g by mouth 3 (three) times daily.    [provider]  loratadine (CLARITIN) 10 MG tablet Take 10 mg by mouth daily.  01/18/14  [provider]    Family History Family History  Problem Relation Age of Onset  . Diabetes Mother   . Heart disease Mother   . Hypertension Mother   . Congestive Heart Failure Mother   . Early death Father   . Hypertension Sister   . Hypertension Brother   . Heart attack Brother   . Hypertension Maternal Grandmother   . Diabetes Maternal Grandmother   . Hypertension Maternal Grandfather   . Hypertension Sister   . Hypertension Brother   . Diabetes Brother   . Heart attack Sister   . Heart disease Sister   . Congestive Heart Failure Sister   . Diabetes Sister   . Hypertension Sister     Social  History Social History   Tobacco Use  . Smoking status: Never Smoker  . Smokeless tobacco: Never Used  Substance Use Topics  . Alcohol use: No    Comment: occ.  . Drug use: No     Allergies   Patient has no known allergies.   Review of Systems Review of Systems   Physical Exam Triage Vital Signs ED Triage Vitals  Enc Vitals Group     BP 07/24/19 1749 (!) 135/94     Pulse Rate 07/24/19 1749 91     Resp 07/24/19 1749 16     Temp 07/24/19 1749 98.4 F (36.9 C)     Temp Source 07/24/19 1749 Oral     SpO2 07/24/19 1749 100 %     Weight --      Height --      Head Circumference --      Peak Flow --      Pain Score 07/24/19 1752 0     Pain Loc --      Pain Edu? --      Excl. in [place]? --    No data found.  Updated Vital Signs BP (!) 135/94 (BP Location: Right Arm)   Pulse 91   Temp 98.4 F (36.9 C) (Oral)   Resp 16   LMP 03/26/2014   SpO2 100%    Physical Exam Constitutional:      General: She is not in acute  distress.    Appearance: She is well-developed.  Cardiovascular:     Rate and Rhythm: Normal rate and regular rhythm.     Heart sounds: Normal heart sounds.  Pulmonary:     Effort: Pulmonary effort is normal.     Breath sounds: Normal breath sounds.  Skin:    General: Skin is warm and dry.  Neurological:     Mental Status: She is alert and oriented to person, place, and time.      UC Treatments / Results  Labs (all labs ordered are listed, but only abnormal results are displayed) Labs Reviewed - No data to display  EKG   Radiology No results found.  Procedures Procedures (including critical care time)  Medications Ordered in UC Medications - No data to display  Initial Impression / Assessment and Plan / UC Course  I have reviewed the triage vital signs and the nursing notes.  Pertinent labs & imaging results that were available during my care of the patient were reviewed by me and considered in my medical decision making (see chart for details).     bp borderline here today 135/94. Her readings have been fluctuating. She does sound somewhat anxious about her readings. At this time will hold off on starting any blood pressure medications, as with foot wound, and now somewhat anxious over bp, I am not convinced that her readings are consistently truly hypertensive. Discussed limiting checking bp to maximum of once a day, but primarily encouraged follow up for a recheck for further evaluation with her PCP. Diet and exercise discussed. She does endorse recent increased stress. Return precautions provided. Patient verbalized understanding and agreeable to plan.   Final Clinical Impressions(s) / UC Diagnoses   Final diagnoses:  Elevated blood pressure reading in office without diagnosis of hypertension     Discharge Instructions     Your blood pressure is only slightly elevated today. Pain, stress, anxiety can be contributors. I would like you to work on stress  management, good diet including limiting salt intake, and regular  activity as these can help decrease your blood pressure.  Don't check your blood pressure more than once a day.  Please follow up with your PCP for recheck and further management if indicated.  Return to be seen if develop significant headache, dizziness, chest pain  or otherwise concerned.    ED Prescriptions    None     PDMP not reviewed this encounter.   Zigmund Gottron, NP 07/24/19 1845

## 2019-07-24 NOTE — ED Triage Notes (Signed)
Pt present elevated blood pressure for two days. The highest Bp is 151/93 for today.

## 2019-07-24 NOTE — Discharge Instructions (Signed)
Your blood pressure is only slightly elevated today. Pain, stress, anxiety can be contributors. I would like you to work on stress management, good diet including limiting salt intake, and regular activity as these can help decrease your blood pressure.  Don't check your blood pressure more than once a day.  Please follow up with your PCP for recheck and further management if indicated.  Return to be seen if develop significant headache, dizziness, chest pain  or otherwise concerned.

## 2019-07-28 ENCOUNTER — Other Ambulatory Visit: Payer: Self-pay | Admitting: *Deleted

## 2019-07-28 ENCOUNTER — Other Ambulatory Visit: Payer: Self-pay | Admitting: Internal Medicine

## 2019-07-28 DIAGNOSIS — Z20822 Contact with and (suspected) exposure to covid-19: Secondary | ICD-10-CM

## 2019-07-29 ENCOUNTER — Ambulatory Visit: Payer: PRIVATE HEALTH INSURANCE | Admitting: Podiatry

## 2019-07-30 LAB — NOVEL CORONAVIRUS, NAA: SARS-CoV-2, NAA: NOT DETECTED

## 2019-08-02 LAB — NOVEL CORONAVIRUS, NAA: SARS-CoV-2, NAA: NOT DETECTED

## 2019-08-04 ENCOUNTER — Other Ambulatory Visit: Payer: Self-pay

## 2019-08-04 ENCOUNTER — Ambulatory Visit: Payer: PRIVATE HEALTH INSURANCE | Admitting: Podiatry

## 2019-08-04 ENCOUNTER — Encounter: Payer: Self-pay | Admitting: Podiatry

## 2019-08-04 VITALS — BP 133/86 | HR 71

## 2019-08-04 DIAGNOSIS — L600 Ingrowing nail: Secondary | ICD-10-CM

## 2019-08-05 NOTE — Progress Notes (Signed)
Subjective:   Patient ID: Leah Lucas, female   DOB: 53 y.o.   MRN: [id number]   HPI Patient concerned about the left big toe where she had a severe blister and states she is been on antibiotics and it seems improved but she is concerned about the structure of her toenails   Review of Systems  All other systems reviewed and are negative.       Objective:  Physical Exam Vitals signs and nursing note reviewed.  Constitutional:      Appearance: She is well-developed.  Pulmonary:     Effort: Pulmonary effort is normal.  Musculoskeletal: Normal range of motion.  Skin:    General: Skin is warm.  Neurological:     Mental Status: She is alert.     Neurovascular status found to be intact muscle strength was found to be adequate with patient's hallux nails found to be dystrophic bilateral with history of blister on the left hallux which appears to be resolved currently with no proximal edema erythema but there is some lifting of the toenail     Assessment:  Probable trauma to the left hallux nail with possibility of underlying fluid buildup and detachment of the matrix     Plan:  H&P reviewed condition and at this point I recommended soaking and bandage usage and I did explain it is possible the nail will need to be removed in the future but I do not recommend doing now as it does not hurt and does not seem to be creating pathology

## 2019-11-24 ENCOUNTER — Ambulatory Visit: Payer: PRIVATE HEALTH INSURANCE | Admitting: General Surgery

## 2019-12-01 ENCOUNTER — Encounter: Payer: Self-pay | Admitting: General Surgery

## 2019-12-01 ENCOUNTER — Ambulatory Visit (INDEPENDENT_AMBULATORY_CARE_PROVIDER_SITE_OTHER): Payer: 59 | Admitting: General Surgery

## 2019-12-01 ENCOUNTER — Other Ambulatory Visit: Payer: Self-pay

## 2019-12-01 VITALS — BP 177/94 | HR 73 | Temp 98.1°F | Resp 14 | Ht 65.0 in | Wt 197.6 lb

## 2019-12-01 DIAGNOSIS — K439 Ventral hernia without obstruction or gangrene: Secondary | ICD-10-CM | POA: Diagnosis not present

## 2019-12-01 NOTE — Patient Instructions (Signed)
Colonoscopy, Adult A colonoscopy is a procedure to look at the entire large intestine. This procedure is done using a long, thin, flexible tube that has a camera on the end. You may have a colonoscopy:  As a part of normal colorectal screening.  If you have certain symptoms, such as: ? A low number of red blood cells in your blood (anemia). ? Diarrhea that does not go away. ? Pain in your abdomen. ? Blood in your stool. A colonoscopy can help screen for and diagnose medical problems, including:  Tumors.  Extra tissue that grows where mucus forms (polyps).  Inflammation.  Areas of bleeding. Tell your health care provider about:  Any allergies you have.  All medicines you are taking, including vitamins, herbs, eye drops, creams, and over-the-counter medicines.  Any problems you or family members have had with anesthetic medicines.  Any blood disorders you have.  Any surgeries you have had.  Any medical conditions you have.  Any problems you have had with having bowel movements.  Whether you are pregnant or may be pregnant. What are the risks? Generally, this is a safe procedure. However, problems may occur, including:  Bleeding.  Damage to your intestine.  Allergic reactions to medicines given during the procedure.  Infection. This is rare. What happens before the procedure? Eating and drinking restrictions Follow instructions from your health care provider about eating or drinking restrictions, which may include:  A few days before the procedure: ? Follow a low-fiber diet. ? Avoid nuts, seeds, dried fruit, raw fruits, and vegetables.  1-3 days before the procedure: ? Eat only gelatin dessert or ice pops. ? Drink only clear liquids, such as water, clear juice, clear broth or bouillon, black coffee or tea, or clear soft drinks or sports drinks. ? Avoid liquids that contain red or purple dye.  The day of the procedure: ? Do not eat solid foods. You may  continue to drink clear liquids until up to 2 hours before the procedure. ? Do not eat or drink anything starting 2 hours before the procedure, or within the time period that your health care provider recommends. Bowel prep If you were prescribed a bowel prep to take by mouth (orally) to clean out your colon:  Take it as told by your health care provider. Starting the day before your procedure, you will need to drink a large amount of liquid medicine. The liquid will cause you to have many bowel movements of loose stool until your stool becomes almost clear or light green.  If your skin or the opening between the buttocks (anus) gets irritated from diarrhea, you may relieve the irritation using: ? Wipes with medicine in them, such as adult wet wipes with aloe and vitamin E. ? A product to soothe skin, such as petroleum jelly.  If you vomit while drinking the bowel prep: ? Take a break for up to 60 minutes. ? Begin the bowel prep again. ? Call your health care provider if you keep vomiting or you cannot take the bowel prep without vomiting.  To clean out your colon, you may also be given: ? Laxative medicines. These help you have a bowel movement. ? Instructions for enema use. An enema is liquid medicine injected into your rectum. Medicines Ask your health care provider about:  Changing or stopping your regular medicines or supplements. This is especially important if you are taking iron supplements, diabetes medicines, or blood thinners.  Taking medicines such as aspirin and ibuprofen. These medicines  can thin your blood. Do not take these medicines unless your health care provider tells you to take them.  Taking over-the-counter medicines, vitamins, herbs, and supplements. General instructions  Ask your health care provider what steps will be taken to help prevent infection. These may include washing skin with a germ-killing soap.  Plan to have someone take you home from the hospital  or clinic. What happens during the procedure?   An IV will be inserted into one of your veins.  You may be given one or more of the following: ? A medicine to help you relax (sedative). ? A medicine to numb the area (local anesthetic). ? A medicine to make you fall asleep (general anesthetic). This is rarely needed.  You will lie on your side with your knees bent.  The tube will: ? Have oil or gel put on it (be lubricated). ? Be inserted into your anus. ? Be gently eased through all parts of your large intestine.  Air will be sent into your colon to keep it open. This may cause some pressure or cramping.  Images will be taken with the camera and will appear on a screen.  A small tissue sample may be removed to be looked at under a microscope (biopsy). The tissue may be sent to a lab for testing if any signs of problems are found.  If small polyps are found, they may be removed and checked for cancer cells.  When the procedure is finished, the tube will be removed. The procedure may vary among health care providers and hospitals. What happens after the procedure?  Your blood pressure, heart rate, breathing rate, and blood oxygen level will be monitored until you leave the hospital or clinic.  You may have a small amount of blood in your stool.  You may pass gas and have mild cramping or bloating in your abdomen. This is caused by the air that was used to open your colon during the exam.  Do not drive for 24 hours after the procedure.  It is up to you to get the results of your procedure. Ask your health care provider, or the department that is doing the procedure, when your results will be ready. Summary  A colonoscopy is a procedure to look at the entire large intestine.  Follow instructions from your health care provider about eating and drinking before the procedure.  If you were prescribed an oral bowel prep to clean out your colon, take it as told by your health care  provider.  During the colonoscopy, a flexible tube with a camera on its end is inserted into the anus and then passed into the other parts of the large intestine. This information is not intended to replace advice given to you by your health care provider. Make sure you discuss any questions you have with your health care provider. Document Revised: 04/15/2019 Document Reviewed: 04/15/2019 Elsevier Patient Education  [place]. Ventral Hernia  A ventral hernia is a bulge of tissue from inside the abdomen that pushes through a weak area of the muscles that form the front wall of the abdomen. The tissues inside the abdomen are inside a sac (peritoneum). These tissues include the small intestine, large intestine, and the fatty tissue that covers the intestines (omentum). Sometimes, the bulge that forms a hernia contains intestines. Other hernias contain only fat. Ventral hernias do not go away without surgical treatment. There are several types of ventral hernias. You may have:  A hernia  at an incision site from previous abdominal surgery (incisional hernia).  A hernia just above the belly button (epigastric hernia), or at the belly button (umbilical hernia). These types of hernias can develop from heavy lifting or straining.  A hernia that comes and goes (reducible hernia). It may be visible only when you lift or strain. This type of hernia can be pushed back into the abdomen (reduced).  A hernia that traps abdominal tissue inside the hernia (incarcerated hernia). This type of hernia does not reduce.  A hernia that cuts off blood flow to the tissues inside the hernia (strangulated hernia). The tissues can start to die if this happens. This is a very painful bulge that cannot be reduced. A strangulated hernia is a medical emergency. What are the causes? This condition is caused by abdominal tissue putting pressure on an area of weakness in the abdominal muscles. What increases the  risk? The following factors may make you more likely to develop this condition:  Being female.  Being 50 or older.  Being overweight or obese.  Having had previous abdominal surgery, especially if there was an infection after surgery.  Having had an injury to the abdominal wall.  Having had several pregnancies.  Having a buildup of fluid inside the abdomen (ascites). What are the signs or symptoms? The only symptom of a ventral hernia may be a painless bulge in the abdomen. A reducible hernia may be visible only when you strain, cough, or lift. Other symptoms may include:  Dull pain.  A feeling of pressure. Signs and symptoms of a strangulated hernia may include:  Increasing pain.  Nausea and vomiting.  Pain when pressing on the hernia.  The skin over the hernia turning red or purple.  Constipation.  Blood in the stool (feces). How is this diagnosed? This condition may be diagnosed based on:  Your symptoms.  Your medical history.  A physical exam. You may be asked to cough or strain while standing. These actions increase the pressure inside your abdomen and force the hernia through the opening in your muscles. Your health care provider may try to reduce the hernia by pressing on it.  Imaging studies, such as an ultrasound or CT scan. How is this treated? This condition is treated with surgery. If you have a strangulated hernia, surgery is done as soon as possible. If your hernia is small and not incarcerated, you may be asked to lose some weight before surgery. Follow these instructions at home:  Follow instructions from your health care provider about eating or drinking restrictions.  If you are overweight, your health care provider may recommend that you increase your activity level and eat a healthier diet.  Do not lift anything that is heavier than 10 lb (4.5 kg).  Return to your normal activities as told by your health care provider. Ask your health care  provider what activities are safe for you. You may need to avoid activities that increase pressure on your hernia.  Take over-the-counter and prescription medicines only as told by your health care provider.  Keep all follow-up visits as told by your health care provider. This is important. Contact a health care provider if:  Your hernia gets larger.  Your hernia becomes painful. Get help right away if:  Your hernia becomes increasingly painful.  You have pain along with any of the following: ? Changes in skin color in the area of the hernia. ? Nausea. ? Vomiting. ? Fever. Summary  A ventral hernia  is a bulge of tissue from inside the abdomen that pushes through a weak area of the muscles that form the front wall of the abdomen.  This condition is treated with surgery, which may be urgent depending on your hernia.  Do not lift anything that is heavier than 10 lb (4.5 kg), and follow activity instructions from your health care provider. This information is not intended to replace advice given to you by your health care provider. Make sure you discuss any questions you have with your health care provider. Document Revised: 11/04/2017 Document Reviewed: 04/13/2017 Elsevier Patient Education  [place].

## 2019-12-01 NOTE — Progress Notes (Signed)
Leah Lucas; [id number]; [birth year]   HPI Patient is a 54 year old black female who was referred to my care for both a ventral hernia and a screening colonoscopy by Dr. Wende Neighbors.  Patient states she has had the hernia since 2014.  It seems to be enlarging in size and is causing her discomfort.  Is made worse with straining.  She currently has 0 out of 10 pain with that.  She also needs a screening colonoscopy.  She has never had a colonoscopy.  She denies any family history of colon cancer, abnormal weight loss, blood per rectum, diarrhea, or constipation. History reviewed. No pertinent past medical history.  History reviewed. No pertinent surgical history.  Family History  Problem Relation Age of Onset  . Diabetes Mother   . Heart disease Mother   . Hypertension Mother   . Congestive Heart Failure Mother   . Early death Father   . Hypertension Sister   . Hypertension Brother   . Heart attack Brother   . Hypertension Maternal Grandmother   . Diabetes Maternal Grandmother   . Hypertension Maternal Grandfather   . Hypertension Sister   . Hypertension Brother   . Diabetes Brother   . Heart attack Sister   . Heart disease Sister   . Congestive Heart Failure Sister   . Diabetes Sister   . Hypertension Sister     Current Outpatient Medications on File Prior to Visit  Medication Sig Dispense Refill  . ibuprofen (ADVIL,MOTRIN) 200 MG tablet Take 400 mg by mouth every 6 (six) hours as needed. For pain    . pravastatin (PRAVACHOL) 20 MG tablet     . [DISCONTINUED] loratadine (CLARITIN) 10 MG tablet Take 10 mg by mouth daily.     No current facility-administered medications on file prior to visit.    No Known Allergies  Social History   Substance and Sexual Activity  Alcohol Use No   Comment: occ.    Social History   Tobacco Use  Smoking Status Never Smoker  Smokeless Tobacco Never Used    Review of Systems  Constitutional: Negative.   HENT: Negative.   Eyes:  Positive for blurred vision.  Respiratory: Negative.   Cardiovascular: Negative.   Gastrointestinal: Negative.   Genitourinary: Negative.   Musculoskeletal: Negative.   Skin: Negative.   Neurological: Negative.   Endo/Heme/Allergies: Negative.   Psychiatric/Behavioral: Negative.     Objective   Vitals:   12/01/19 0937  BP: (!) 177/94  Pulse: 73  Resp: 14  Temp: 98.1 F (36.7 C)  SpO2: 96%    Physical Exam Vitals reviewed.  Constitutional:      Appearance: Normal appearance. She is not ill-appearing.  HENT:     Head: Normocephalic and atraumatic.  Cardiovascular:     Rate and Rhythm: Normal rate and regular rhythm.     Heart sounds: Normal heart sounds. No murmur. No friction rub. No gallop.   Pulmonary:     Effort: Pulmonary effort is normal. No respiratory distress.     Breath sounds: Normal breath sounds. No stridor. No wheezing, rhonchi or rales.  Abdominal:     General: Bowel sounds are normal. There is no distension.     Palpations: Abdomen is soft. There is no mass.     Tenderness: There is no abdominal tenderness. There is no guarding or rebound.     Hernia: A hernia is present.     Comments: 3 to 4 cm reducible ventral hernia along the midline below the xiphoid  process.  No umbilical hernia present.  Skin:    General: Skin is warm and dry.  Neurological:     Mental Status: She is alert and oriented to person, place, and time.    Primary care notes reviewed Assessment  Ventral hernia Need for screening colonoscopy Plan   Patient would like to proceed with ventral herniorrhaphy with mesh prior to screening colonoscopy.  The risks and benefits including bleeding, infection, mesh use, the possibility of recurrence of the hernia were fully explained to the patient, who gave informed consent.  She will call to schedule surgery.

## 2019-12-14 ENCOUNTER — Telehealth: Payer: Self-pay

## 2019-12-14 NOTE — Telephone Encounter (Signed)
Patient called office and stated the work restriction note faxed to employer yesterday to state -resume to normal work activities otherwise she would need to resign from her job.  'Patient added to schedule next week to discuss further.

## 2019-12-15 ENCOUNTER — Telehealth: Payer: Self-pay

## 2019-12-15 NOTE — Telephone Encounter (Signed)
Patient called office today- her employer has asked her to resign due to her stating she is unable to lift- Patient requested note stating that she can resume normal activities. Note was faxed to [place] at [facility]. Patient called this morning and is requesting to schedule surgery 12/23/19 for the Ventral Hernia repair. I let her know I would let Claiborne Billings and dr.jenkins know and call back in a few hours and we would let her know.

## 2019-12-19 ENCOUNTER — Other Ambulatory Visit (HOSPITAL_COMMUNITY): Payer: Self-pay | Admitting: Internal Medicine

## 2019-12-19 DIAGNOSIS — Z1231 Encounter for screening mammogram for malignant neoplasm of breast: Secondary | ICD-10-CM

## 2019-12-20 ENCOUNTER — Ambulatory Visit: Payer: Self-pay | Admitting: General Surgery

## 2019-12-21 NOTE — Patient Instructions (Signed)
Your procedure is scheduled on:12/28/2019   Report to [facility] at   8:30  AM.  Call this number if you have problems the morning of surgery: [phone]   Remember:   Do not Eat or Drink after midnight         No Smoking the morning of surgery  :  Take these medicines the morning of surgery with A SIP OF WATER: claritin   Do not wear jewelry, make-up or nail polish.  Do not wear lotions, powders, or perfumes. You may wear deodorant.  Do not shave 48 hours prior to surgery. Men may shave face and neck.  Do not bring valuables to the hospital.  Contacts, dentures or bridgework may not be worn into surgery.  Leave suitcase in the car. After surgery it may be brought to your room.  For patients admitted to the hospital, checkout time is 11:00 AM the day of discharge.   Patients discharged the day of surgery will not be allowed to drive home.    Special Instructions: Shower using CHG night before surgery and shower the day of surgery use CHG.  Use special wash - you have one bottle of CHG for all showers.  You should use approximately 1/2 of the bottle for each shower.  Ventral Hernia  A ventral hernia is a bulge of tissue from inside the abdomen that pushes through a weak area of the muscles that form the front wall of the abdomen. The tissues inside the abdomen are inside a sac (peritoneum). These tissues include the small intestine, large intestine, and the fatty tissue that covers the intestines (omentum). Sometimes, the bulge that forms a hernia contains intestines. Other hernias contain only fat. Ventral hernias do not go away without surgical treatment. There are several types of ventral hernias. You may have:  A hernia at an incision site from previous abdominal surgery (incisional hernia).  A hernia just above the belly button (epigastric hernia), or at the belly button (umbilical hernia). These types of hernias can develop from heavy lifting or straining.  A hernia that  comes and goes (reducible hernia). It may be visible only when you lift or strain. This type of hernia can be pushed back into the abdomen (reduced).  A hernia that traps abdominal tissue inside the hernia (incarcerated hernia). This type of hernia does not reduce.  A hernia that cuts off blood flow to the tissues inside the hernia (strangulated hernia). The tissues can start to die if this happens. This is a very painful bulge that cannot be reduced. A strangulated hernia is a medical emergency. What are the causes? This condition is caused by abdominal tissue putting pressure on an area of weakness in the abdominal muscles. What increases the risk? The following factors may make you more likely to develop this condition:  Being female.  Being 34 or older.  Being overweight or obese.  Having had previous abdominal surgery, especially if there was an infection after surgery.  Having had an injury to the abdominal wall.  Having had several pregnancies.  Having a buildup of fluid inside the abdomen (ascites). What are the signs or symptoms? The only symptom of a ventral hernia may be a painless bulge in the abdomen. A reducible hernia may be visible only when you strain, cough, or lift. Other symptoms may include:  Dull pain.  A feeling of pressure. Signs and symptoms of a strangulated hernia may include:  Increasing pain.  Nausea and vomiting.  Pain  when pressing on the hernia.  The skin over the hernia turning red or purple.  Constipation.  Blood in the stool (feces). How is this diagnosed? This condition may be diagnosed based on:  Your symptoms.  Your medical history.  A physical exam. You may be asked to cough or strain while standing. These actions increase the pressure inside your abdomen and force the hernia through the opening in your muscles. Your health care provider may try to reduce the hernia by pressing on it.  Imaging studies, such as an ultrasound or CT  scan. How is this treated? This condition is treated with surgery. If you have a strangulated hernia, surgery is done as soon as possible. If your hernia is small and not incarcerated, you may be asked to lose some weight before surgery. Follow these instructions at home:  Follow instructions from your health care provider about eating or drinking restrictions.  If you are overweight, your health care provider may recommend that you increase your activity level and eat a healthier diet.  Do not lift anything that is heavier than 10 lb (4.5 kg).  Return to your normal activities as told by your health care provider. Ask your health care provider what activities are safe for you. You may need to avoid activities that increase pressure on your hernia.  Take over-the-counter and prescription medicines only as told by your health care provider.  Keep all follow-up visits as told by your health care provider. This is important. Contact a health care provider if:  Your hernia gets larger.  Your hernia becomes painful. Get help right away if:  Your hernia becomes increasingly painful.  You have pain along with any of the following: ? Changes in skin color in the area of the hernia. ? Nausea. ? Vomiting. ? Fever. Summary  A ventral hernia is a bulge of tissue from inside the abdomen that pushes through a weak area of the muscles that form the front wall of the abdomen.  This condition is treated with surgery, which may be urgent depending on your hernia.  Do not lift anything that is heavier than 10 lb (4.5 kg), and follow activity instructions from your health care provider. This information is not intended to replace advice given to you by your health care provider. Make sure you discuss any questions you have with your health care provider. Document Revised: 11/04/2017 Document Reviewed: 04/13/2017 Elsevier Patient Education  [place] Repair, Adult, Care  After These instructions give you information about caring for yourself after your procedure. Your doctor may also give you more specific instructions. If you have problems or questions, contact your doctor. Follow these instructions at home: Surgical cut (incision) care   Follow instructions from your doctor about how to take care of your surgical cut area. Make sure you: ? Wash your hands with soap and water before you change your bandage (dressing). If you cannot use soap and water, use hand sanitizer. ? Change your bandage as told by your doctor. ? Leave stitches (sutures), skin glue, or skin tape (adhesive) strips in place. They may need to stay in place for 2 weeks or longer. If tape strips get loose and curl up, you may trim the loose edges. Do not remove tape strips completely unless your doctor says it is okay.  Check your surgical cut every day for signs of infection. Check for: ? More redness, swelling, or pain. ? More fluid or blood. ? Warmth. ?  Pus or a bad smell. Activity  Do not drive or use heavy machinery while taking prescription pain medicine. Do not drive until your doctor says it is okay.  Until your doctor says it is okay: ? Do not lift anything that is heavier than 10 lb (4.5 kg). ? Do not play contact sports.  Return to your normal activities as told by your doctor. Ask your doctor what activities are safe. General instructions  To prevent or treat having a hard time pooping (constipation) while you are taking prescription pain medicine, your doctor may recommend that you: ? Drink enough fluid to keep your pee (urine) clear or pale yellow. ? Take over-the-counter or prescription medicines. ? Eat foods that are high in fiber, such as fresh fruits and vegetables, whole grains, and beans. ? Limit foods that are high in fat and processed sugars, such as fried and sweet foods.  Take over-the-counter and prescription medicines only as told by your doctor.  Do not  take baths, swim, or use a hot tub until your doctor says it is okay.  Keep all follow-up visits as told by your doctor. This is important. Contact a doctor if:  You develop a rash.  You have more redness, swelling, or pain around your surgical cut.  You have more fluid or blood coming from your surgical cut.  Your surgical cut feels warm to the touch.  You have pus or a bad smell coming from your surgical cut.  You have a fever or chills.  You have blood in your poop (stool).  You have not pooped in 2-3 days.  Medicine does not help your pain. Get help right away if:  You have chest pain or you are short of breath.  You feel light-headed.  You feel weak and dizzy (feel faint).  You have very bad pain.  You throw up (vomit) and your pain is worse. This information is not intended to replace advice given to you by your health care provider. Make sure you discuss any questions you have with your health care provider. Document Revised: 01/14/2019 Document Reviewed: 03/05/2016 Elsevier Patient Education  2020 [place] Anesthesia, Adult, Care After This sheet gives you information about how to care for yourself after your procedure. Your health care provider may also give you more specific instructions. If you have problems or questions, contact your health care provider. What can I expect after the procedure? After the procedure, the following side effects are common:  Pain or discomfort at the IV site.  Nausea.  Vomiting.  Sore throat.  Trouble concentrating.  Feeling cold or chills.  Weak or tired.  Sleepiness and fatigue.  Soreness and body aches. These side effects can affect parts of the body that were not involved in surgery. Follow these instructions at home:  For at least 24 hours after the procedure:  Have a responsible adult stay with you. It is important to have someone help care for you until you are awake and alert.  Rest as  needed.  Do not: ? Participate in activities in which you could fall or become injured. ? Drive. ? Use heavy machinery. ? Drink alcohol. ? Take sleeping pills or medicines that cause drowsiness. ? Make important decisions or sign legal documents. ? Take care of children on your own. Eating and drinking  Follow any instructions from your health care provider about eating or drinking restrictions.  When you feel hungry, start by eating small amounts of foods that  are soft and easy to digest (bland), such as toast. Gradually return to your regular diet.  Drink enough fluid to keep your urine pale yellow.  If you vomit, rehydrate by drinking water, juice, or clear broth. General instructions  If you have sleep apnea, surgery and certain medicines can increase your risk for breathing problems. Follow instructions from your health care provider about wearing your sleep device: ? Anytime you are sleeping, including during daytime naps. ? While taking prescription pain medicines, sleeping medicines, or medicines that make you drowsy.  Return to your normal activities as told by your health care provider. Ask your health care provider what activities are safe for you.  Take over-the-counter and prescription medicines only as told by your health care provider.  If you smoke, do not smoke without supervision.  Keep all follow-up visits as told by your health care provider. This is important. Contact a health care provider if:  You have nausea or vomiting that does not get better with medicine.  You cannot eat or drink without vomiting.  You have pain that does not get better with medicine.  You are unable to pass urine.  You develop a skin rash.  You have a fever.  You have redness around your IV site that gets worse. Get help right away if:  You have difficulty breathing.  You have chest pain.  You have blood in your urine or stool, or you vomit blood. Summary  After the  procedure, it is common to have a sore throat or nausea. It is also common to feel tired.  Have a responsible adult stay with you for the first 24 hours after general anesthesia. It is important to have someone help care for you until you are awake and alert.  When you feel hungry, start by eating small amounts of foods that are soft and easy to digest (bland), such as toast. Gradually return to your regular diet.  Drink enough fluid to keep your urine pale yellow.  Return to your normal activities as told by your health care provider. Ask your health care provider what activities are safe for you. This information is not intended to replace advice given to you by your health care provider. Make sure you discuss any questions you have with your health care provider. Document Revised: 09/25/2017 Document Reviewed: 05/08/2017 Elsevier Patient Education  [place].

## 2019-12-21 NOTE — H&P (Signed)
Leah Lucas; [id number]; [birth year]   HPI Patient is a 54 year old black female who was referred to my care for both a ventral hernia and a screening colonoscopy by Dr. Wende Neighbors.  Patient states she has had the hernia since 2014.  It seems to be enlarging in size and is causing her discomfort.  Is made worse with straining.  She currently has 0 out of 10 pain with that.  She also needs a screening colonoscopy.  She has never had a colonoscopy.  She denies any family history of colon cancer, abnormal weight loss, blood per rectum, diarrhea, or constipation. History reviewed. No pertinent past medical history.  History reviewed. No pertinent surgical history.  Family History  Problem Relation Age of Onset  . Diabetes Mother   . Heart disease Mother   . Hypertension Mother   . Congestive Heart Failure Mother   . Early death Father   . Hypertension Sister   . Hypertension Brother   . Heart attack Brother   . Hypertension Maternal Grandmother   . Diabetes Maternal Grandmother   . Hypertension Maternal Grandfather   . Hypertension Sister   . Hypertension Brother   . Diabetes Brother   . Heart attack Sister   . Heart disease Sister   . Congestive Heart Failure Sister   . Diabetes Sister   . Hypertension Sister     Current Outpatient Medications on File Prior to Visit  Medication Sig Dispense Refill  . ibuprofen (ADVIL,MOTRIN) 200 MG tablet Take 400 mg by mouth every 6 (six) hours as needed. For pain    . pravastatin (PRAVACHOL) 20 MG tablet     . [DISCONTINUED] loratadine (CLARITIN) 10 MG tablet Take 10 mg by mouth daily.     No current facility-administered medications on file prior to visit.    No Known Allergies  Social History   Substance and Sexual Activity  Alcohol Use No   Comment: occ.    Social History   Tobacco Use  Smoking Status Never Smoker  Smokeless Tobacco Never Used    Review of Systems  Constitutional: Negative.   HENT: Negative.   Eyes:  Positive for blurred vision.  Respiratory: Negative.   Cardiovascular: Negative.   Gastrointestinal: Negative.   Genitourinary: Negative.   Musculoskeletal: Negative.   Skin: Negative.   Neurological: Negative.   Endo/Heme/Allergies: Negative.   Psychiatric/Behavioral: Negative.     Objective   Vitals:   12/01/19 0937  BP: (!) 177/94  Pulse: 73  Resp: 14  Temp: 98.1 F (36.7 C)  SpO2: 96%    Physical Exam Vitals reviewed.  Constitutional:      Appearance: Normal appearance. She is not ill-appearing.  HENT:     Head: Normocephalic and atraumatic.  Cardiovascular:     Rate and Rhythm: Normal rate and regular rhythm.     Heart sounds: Normal heart sounds. No murmur. No friction rub. No gallop.   Pulmonary:     Effort: Pulmonary effort is normal. No respiratory distress.     Breath sounds: Normal breath sounds. No stridor. No wheezing, rhonchi or rales.  Abdominal:     General: Bowel sounds are normal. There is no distension.     Palpations: Abdomen is soft. There is no mass.     Tenderness: There is no abdominal tenderness. There is no guarding or rebound.     Hernia: A hernia is present.     Comments: 3 to 4 cm reducible ventral hernia along the midline below the xiphoid  process.  No umbilical hernia present.  Skin:    General: Skin is warm and dry.  Neurological:     Mental Status: She is alert and oriented to person, place, and time.    Primary care notes reviewed Assessment  Ventral hernia Need for screening colonoscopy Plan   Patient would like to proceed with ventral herniorrhaphy with mesh prior to screening colonoscopy.  The risks and benefits including bleeding, infection, mesh use, the possibility of recurrence of the hernia were fully explained to the patient, who gave informed consent.  She will call to schedule surgery.

## 2019-12-22 ENCOUNTER — Other Ambulatory Visit: Payer: Self-pay

## 2019-12-22 ENCOUNTER — Ambulatory Visit (HOSPITAL_COMMUNITY)
Admission: RE | Admit: 2019-12-22 | Discharge: 2019-12-22 | Disposition: A | Discharge status: Home / Self Care | Payer: BC Managed Care – PPO | Source: Ambulatory Visit | Attending: Internal Medicine | Admitting: Internal Medicine

## 2019-12-22 DIAGNOSIS — Z1231 Encounter for screening mammogram for malignant neoplasm of breast: Secondary | ICD-10-CM | POA: Insufficient documentation

## 2019-12-26 ENCOUNTER — Encounter (HOSPITAL_COMMUNITY): Payer: Self-pay

## 2019-12-26 ENCOUNTER — Other Ambulatory Visit: Payer: Self-pay

## 2019-12-26 ENCOUNTER — Encounter (HOSPITAL_COMMUNITY)
Admission: RE | Admit: 2019-12-26 | Discharge: 2019-12-26 | Disposition: A | Discharge status: Home / Self Care | Payer: BC Managed Care – PPO | Source: Ambulatory Visit | Attending: General Surgery | Admitting: General Surgery

## 2019-12-26 ENCOUNTER — Other Ambulatory Visit (HOSPITAL_COMMUNITY)
Admission: RE | Admit: 2019-12-26 | Discharge: 2019-12-26 | Disposition: A | Discharge status: Home / Self Care | Payer: BC Managed Care – PPO | Source: Ambulatory Visit | Attending: General Surgery | Admitting: General Surgery

## 2019-12-26 DIAGNOSIS — Z20822 Contact with and (suspected) exposure to covid-19: Secondary | ICD-10-CM | POA: Insufficient documentation

## 2019-12-26 DIAGNOSIS — Z01812 Encounter for preprocedural laboratory examination: Secondary | ICD-10-CM | POA: Diagnosis present

## 2019-12-26 HISTORY — DX: Pure hypercholesterolemia, unspecified: E78.00

## 2019-12-26 HISTORY — DX: Other seasonal allergic rhinitis: J30.2

## 2019-12-26 LAB — CBC
HCT: 44.6 % (ref 36.0–46.0)
Hemoglobin: 14.1 g/dL (ref 12.0–15.0)
MCH: 27.4 pg (ref 26.0–34.0)
MCHC: 31.6 g/dL (ref 30.0–36.0)
MCV: 86.6 fL (ref 80.0–100.0)
Platelets: 422 10*3/uL — ABNORMAL HIGH (ref 150–400)
RBC: 5.15 MIL/uL — ABNORMAL HIGH (ref 3.87–5.11)
RDW: 13.2 % (ref 11.5–15.5)
WBC: 5.4 10*3/uL (ref 4.0–10.5)
nRBC: 0 % (ref 0.0–0.2)

## 2019-12-27 LAB — SARS CORONAVIRUS 2 (TAT 6-24 HRS): SARS Coronavirus 2: NEGATIVE

## 2019-12-28 ENCOUNTER — Ambulatory Visit (HOSPITAL_COMMUNITY): Payer: BC Managed Care – PPO | Admitting: Anesthesiology

## 2019-12-28 ENCOUNTER — Ambulatory Visit (HOSPITAL_COMMUNITY)
Admission: RE | Admit: 2019-12-28 | Discharge: 2019-12-28 | Disposition: A | Discharge status: Home / Self Care | Payer: BC Managed Care – PPO | Attending: General Surgery | Admitting: General Surgery

## 2019-12-28 ENCOUNTER — Encounter (HOSPITAL_COMMUNITY)
Admission: RE | Disposition: A | Discharge status: Home / Self Care | Payer: Self-pay | Source: Home / Self Care | Attending: General Surgery

## 2019-12-28 ENCOUNTER — Encounter (HOSPITAL_COMMUNITY): Payer: Self-pay | Admitting: General Surgery

## 2019-12-28 DIAGNOSIS — K439 Ventral hernia without obstruction or gangrene: Secondary | ICD-10-CM | POA: Diagnosis present

## 2019-12-28 DIAGNOSIS — Z79899 Other long term (current) drug therapy: Secondary | ICD-10-CM | POA: Insufficient documentation

## 2019-12-28 DIAGNOSIS — Z8249 Family history of ischemic heart disease and other diseases of the circulatory system: Secondary | ICD-10-CM | POA: Diagnosis not present

## 2019-12-28 HISTORY — PX: VENTRAL HERNIA REPAIR: SHX424

## 2019-12-28 SURGERY — REPAIR, HERNIA, VENTRAL
Anesthesia: General | Site: Abdomen

## 2019-12-28 MED ORDER — ONDANSETRON HCL 4 MG/2ML IJ SOLN
INTRAMUSCULAR | Status: AC
  Filled 2019-12-28: qty 2

## 2019-12-28 MED ORDER — BUPIVACAINE LIPOSOME 1.3 % IJ SUSP
INTRAMUSCULAR | Status: AC
  Filled 2019-12-28: qty 20

## 2019-12-28 MED ORDER — CHLORHEXIDINE GLUCONATE CLOTH 2 % EX PADS: 6.0000 | MEDICATED_PAD | Freq: Once | CUTANEOUS | Status: DC

## 2019-12-28 MED ORDER — PROPOFOL 10 MG/ML IV BOLUS
INTRAVENOUS | Status: DC | PRN
  Administered 2019-12-28: 200 mg via INTRAVENOUS

## 2019-12-28 MED ORDER — LIDOCAINE 2% (20 MG/ML) 5 ML SYRINGE
INTRAMUSCULAR | Status: AC
  Filled 2019-12-28: qty 5

## 2019-12-28 MED ORDER — FENTANYL CITRATE (PF) 100 MCG/2ML IJ SOLN
INTRAMUSCULAR | Status: AC
  Filled 2019-12-28: qty 2

## 2019-12-28 MED ORDER — KETOROLAC TROMETHAMINE 30 MG/ML IJ SOLN
30.0000 mg | Freq: Once | INTRAMUSCULAR | Status: AC
  Administered 2019-12-28: 30 mg via INTRAVENOUS
  Filled 2019-12-28: qty 1

## 2019-12-28 MED ORDER — DEXAMETHASONE SODIUM PHOSPHATE 10 MG/ML IJ SOLN
INTRAMUSCULAR | Status: DC | PRN
  Administered 2019-12-28: 5 mg via INTRAVENOUS

## 2019-12-28 MED ORDER — LACTATED RINGERS IV SOLN
INTRAVENOUS | Status: DC
  Administered 2019-12-28: 1000 mL via INTRAVENOUS

## 2019-12-28 MED ORDER — ONDANSETRON HCL 4 MG/2ML IJ SOLN
INTRAMUSCULAR | Status: DC | PRN
  Administered 2019-12-28: 4 mg via INTRAVENOUS

## 2019-12-28 MED ORDER — LIDOCAINE HCL (CARDIAC) PF 100 MG/5ML IV SOSY
PREFILLED_SYRINGE | INTRAVENOUS | Status: DC | PRN
  Administered 2019-12-28: 60 mg via INTRAVENOUS

## 2019-12-28 MED ORDER — HYDROCODONE-ACETAMINOPHEN 5-325 MG PO TABS: 1.0000 | ORAL_TABLET | ORAL | 0 refills | Status: DC | PRN

## 2019-12-28 MED ORDER — BUPIVACAINE LIPOSOME 1.3 % IJ SUSP
INTRAMUSCULAR | Status: DC | PRN
  Administered 2019-12-28: 15 mL

## 2019-12-28 MED ORDER — FENTANYL CITRATE (PF) 100 MCG/2ML IJ SOLN
INTRAMUSCULAR | Status: DC | PRN
  Administered 2019-12-28: 100 ug via INTRAVENOUS

## 2019-12-28 MED ORDER — DEXAMETHASONE SODIUM PHOSPHATE 10 MG/ML IJ SOLN
INTRAMUSCULAR | Status: AC
  Filled 2019-12-28: qty 1

## 2019-12-28 MED ORDER — MIDAZOLAM HCL 5 MG/5ML IJ SOLN
INTRAMUSCULAR | Status: DC | PRN
  Administered 2019-12-28: 2 mg via INTRAVENOUS

## 2019-12-28 MED ORDER — 0.9 % SODIUM CHLORIDE (POUR BTL) OPTIME
TOPICAL | Status: DC | PRN
  Administered 2019-12-28: 1000 mL

## 2019-12-28 MED ORDER — CEFAZOLIN SODIUM-DEXTROSE 2-4 GM/100ML-% IV SOLN
2.0000 g | INTRAVENOUS | Status: AC
  Administered 2019-12-28: 2 g via INTRAVENOUS
  Filled 2019-12-28: qty 100

## 2019-12-28 MED ORDER — MIDAZOLAM HCL 2 MG/2ML IJ SOLN
INTRAMUSCULAR | Status: AC
  Filled 2019-12-28: qty 2

## 2019-12-28 MED ORDER — FENTANYL CITRATE (PF) 100 MCG/2ML IJ SOLN
25.0000 ug | INTRAMUSCULAR | Status: DC | PRN
  Filled 2019-12-28: qty 2

## 2019-12-28 SURGICAL SUPPLY — 36 items
ADH SKN CLS APL DERMABOND .7 (GAUZE/BANDAGES/DRESSINGS) ×1
APL PRP STRL LF DISP 70% ISPRP (MISCELLANEOUS) ×1
CHLORAPREP W/TINT 26 (MISCELLANEOUS) ×2 IMPLANT
CLOTH BEACON ORANGE TIMEOUT ST (SAFETY) ×2 IMPLANT
COVER LIGHT HANDLE STERIS (MISCELLANEOUS) ×4 IMPLANT
COVER WAND RF STERILE (DRAPES) ×2 IMPLANT
DERMABOND ADVANCED (GAUZE/BANDAGES/DRESSINGS) ×1
DERMABOND ADVANCED .7 DNX12 (GAUZE/BANDAGES/DRESSINGS) IMPLANT
ELECT REM PT RETURN 9FT ADLT (ELECTROSURGICAL) ×2
ELECTRODE REM PT RTRN 9FT ADLT (ELECTROSURGICAL) ×1 IMPLANT
GAUZE SPONGE 4X4 12PLY STRL (GAUZE/BANDAGES/DRESSINGS) ×2 IMPLANT
GLOVE BIO SURGEON STRL SZ7 (GLOVE) ×1 IMPLANT
GLOVE BIOGEL PI IND STRL 7.0 (GLOVE) ×3 IMPLANT
GLOVE BIOGEL PI IND STRL 7.5 (GLOVE) IMPLANT
GLOVE BIOGEL PI INDICATOR 7.0 (GLOVE) ×3
GLOVE BIOGEL PI INDICATOR 7.5 (GLOVE) ×1
GLOVE ECLIPSE 6.5 STRL STRAW (GLOVE) ×1 IMPLANT
GLOVE SURG SS PI 7.5 STRL IVOR (GLOVE) ×2 IMPLANT
GOWN STRL REUS W/TWL LRG LVL3 (GOWN DISPOSABLE) ×6 IMPLANT
INST SET MAJOR GENERAL (KITS) ×2 IMPLANT
KIT TURNOVER KIT A (KITS) ×2 IMPLANT
LIGASURE IMPACT 36 18CM CVD LR (INSTRUMENTS) ×1 IMPLANT
MANIFOLD NEPTUNE II (INSTRUMENTS) ×2 IMPLANT
MESH VENTRALEX ST 1-7/10 CRC S (Mesh General) ×1 IMPLANT
NDL HYPO 18GX1.5 BLUNT FILL (NEEDLE) ×1 IMPLANT
NEEDLE HYPO 18GX1.5 BLUNT FILL (NEEDLE) ×2 IMPLANT
NEEDLE HYPO 22GX1.5 SAFETY (NEEDLE) ×2 IMPLANT
NS IRRIG 1000ML POUR BTL (IV SOLUTION) ×2 IMPLANT
PACK ABDOMINAL MAJOR (CUSTOM PROCEDURE TRAY) ×2 IMPLANT
PAD ARMBOARD 7.5X6 YLW CONV (MISCELLANEOUS) ×2 IMPLANT
SET BASIN LINEN APH (SET/KITS/TRAYS/PACK) ×2 IMPLANT
SUT ETHIBOND NAB MO 7 #0 18IN (SUTURE) ×1 IMPLANT
SUT MNCRL AB 4-0 PS2 18 (SUTURE) ×1 IMPLANT
SUT VIC AB 3-0 SH 27 (SUTURE) ×2
SUT VIC AB 3-0 SH 27X BRD (SUTURE) IMPLANT
SYR 20ML LL LF (SYRINGE) ×4 IMPLANT

## 2019-12-28 NOTE — Op Note (Signed)
Patient:  Leah Lucas  DOB:  [birth year]  MRN:  [id number]   Preop Diagnosis:  Ventral hernia   Postop Diagnosis:  Same   Procedure:  Ventral herniorrhaphy with mesh   Surgeon:  Aviva Signs, MD  Anes:  GET  Indications: Patient is a 54 year old black female who presents with an epigastric ventral hernia along the midline.  The risks and benefits of the procedure including bleeding, infection, mesh use, the possibility of recurrence of the hernia were fully explained to the patient, who gave informed consent.  Procedure note: The patient was placed in the supine position.  After general anesthesia was administered, the upper abdomen was prepped and draped using the usual sterile technique with ChloraPrep.  Surgical site confirmation was performed.  A longitudinal incision was made in the epigastric region over the palpable hernia.  The dissection was taken down to the fascia.  The patient was noted to have a small 1.5 cm fascial defect with omentum mushrooming through it.  The hernia sac was excised and the LigaSure was used to excise the excess omentum.  The omentum was then reduced.  A 4.3 cm Bard Ventralax ST patch was then inserted and secured to the fascia using 0 Ethibond interrupted sutures.  The overlying fascia was reapproximated transversely using 0 Ethibond interrupted sutures.  Care was taken to avoid any bowel.  The subcutaneous layer was reapproximated using 3-0 Vicryl interrupted sutures.  Exparel was instilled into the surrounding wound.  The skin was closed using a 4-0 Monocryl subcuticular suture.  Dermabond was applied.  All tape and needle counts were correct at the end of the procedure.  The patient was awakened and transferred to PACU in stable condition.  Complications: None  EBL: Minimal  Specimen: None

## 2019-12-28 NOTE — Discharge Instructions (Signed)
  Open Hernia Repair, Adult, Care After This sheet gives you information about how to care for yourself after your procedure. Your health care provider may also give you more specific instructions. If you have problems or questions, contact your health care provider. What can I expect after the procedure? After the procedure, it is common to have:  Mild discomfort.  Slight bruising.  Minor swelling.  Pain in the abdomen. Follow these instructions at home: Incision care   Follow instructions from your health care provider about how to take care of your incision area. Make sure you: ? Wash your hands with soap and water before you change your bandage (dressing). If soap and water are not available, use hand sanitizer. ? Change your dressing as told by your health care provider. ? Leave stitches (sutures), skin glue, or adhesive strips in place. These skin closures may need to stay in place for 2 weeks or longer. If adhesive strip edges start to loosen and curl up, you may trim the loose edges. Do not remove adhesive strips completely unless your health care provider tells you to do that.  Check your incision area every day for signs of infection. Check for: ? More redness, swelling, or pain. ? More fluid or blood. ? Warmth. ? Pus or a bad smell. Activity  Do not drive or use heavy machinery while taking prescription pain medicine. Do not drive until your health care provider approves.  Until your health care provider approves: ? Do not lift anything that is heavier than 10 lb (4.5 kg). ? Do not play contact sports.  Return to your normal activities as told by your health care provider. Ask your health care provider what activities are safe. General instructions  To prevent or treat constipation while you are taking prescription pain medicine, your health care provider may recommend that you: ? Drink enough fluid to keep your urine clear or pale yellow. ? Take over-the-counter or  prescription medicines. ? Eat foods that are high in fiber, such as fresh fruits and vegetables, whole grains, and beans. ? Limit foods that are high in fat and processed sugars, such as fried and sweet foods.  Take over-the-counter and prescription medicines only as told by your health care provider.  Do not take tub baths or go swimming until your health care provider approves.  Keep all follow-up visits as told by your health care provider. This is important. Contact a health care provider if:  You develop a rash.  You have more redness, swelling, or pain around your incision.  You have more fluid or blood coming from your incision.  Your incision feels warm to the touch.  You have pus or a bad smell coming from your incision.  You have a fever or chills.  You have blood in your stool (feces).  You have not had a bowel movement in 2-3 days.  Your pain is not controlled with medicine. Get help right away if:  You have chest pain or shortness of breath.  You feel light-headed or feel faint.  You have severe pain.  You vomit and your pain is worse. This information is not intended to replace advice given to you by your health care provider. Make sure you discuss any questions you have with your health care provider. Document Revised: 09/04/2017 Document Reviewed: 03/05/2016 Elsevier Patient Education  2020 Elsevier Inc.  

## 2019-12-28 NOTE — Anesthesia Preprocedure Evaluation (Signed)
Anesthesia Evaluation  Patient identified by MRN, date of birth, ID band Patient awake    Reviewed: Allergy & Precautions, H&P , NPO status , Patient's Chart, lab work & pertinent test results, reviewed documented beta blocker date and time   Airway Mallampati: II  TM Distance: >3 FB Neck ROM: full    Dental no notable dental hx.    Pulmonary neg pulmonary ROS,    Pulmonary exam normal        Cardiovascular negative cardio ROS Normal cardiovascular exam     Neuro/Psych negative neurological ROS  negative psych ROS   GI/Hepatic negative GI ROS, Neg liver ROS,   Endo/Other  negative endocrine ROS  Renal/GU negative Renal ROS  negative genitourinary   Musculoskeletal   Abdominal   Peds  Hematology negative hematology ROS (+)   Anesthesia Other Findings   Reproductive/Obstetrics negative OB ROS                             Anesthesia Physical Anesthesia Plan  ASA: I  Anesthesia Plan: General   Post-op Pain Management:    Induction:   PONV Risk Score and Plan: 2 and Ondansetron  Airway Management Planned:   Additional Equipment:   Intra-op Plan:   Post-operative Plan:   Informed Consent: I have reviewed the patients History and Physical, chart, labs and discussed the procedure including the risks, benefits and alternatives for the proposed anesthesia with the patient or authorized representative who has indicated his/her understanding and acceptance.       Plan Discussed with: CRNA  Anesthesia Plan Comments:         Anesthesia Quick Evaluation

## 2019-12-28 NOTE — Transfer of Care (Signed)
Immediate Anesthesia Transfer of Care Note  Patient: Leah Lucas  Procedure(s) Performed: HERNIA REPAIR VENTRAL ADULT WITH MESH (N/A Abdomen)  Patient Location: PACU  Anesthesia Type:General  Level of Consciousness: awake, alert , oriented and patient cooperative  Airway & Oxygen Therapy: Patient Spontanous Breathing  Post-op Assessment: Report given to RN, Post -op Vital signs reviewed and stable and Patient moving all extremities X 4  Post vital signs: Reviewed and stable  Last Vitals:  Vitals Value Taken Time  BP    Temp    Pulse    Resp    SpO2      Last Pain:  Vitals:   12/28/19 0845  TempSrc: Oral  PainSc: 0-No pain      Patients Stated Pain Goal: 6 ([id number])  Complications: No apparent anesthesia complications

## 2019-12-28 NOTE — Anesthesia Postprocedure Evaluation (Signed)
Anesthesia Post Note  Patient: Leah Lucas  Procedure(s) Performed: HERNIA REPAIR VENTRAL ADULT WITH MESH (N/A Abdomen)  Patient location during evaluation: PACU Anesthesia Type: General Level of consciousness: awake, oriented, sedated and patient cooperative Pain management: pain level controlled Vital Signs Assessment: post-procedure vital signs reviewed and stable Respiratory status: spontaneous breathing, respiratory function stable and nonlabored ventilation Cardiovascular status: stable Postop Assessment: no apparent nausea or vomiting Anesthetic complications: no     Last Vitals:  Vitals:   12/28/19 0845  BP: 133/77  Pulse: 65  Resp: 20  Temp: 36.9 C  SpO2: 98%    Last Pain:  Vitals:   12/28/19 0845  TempSrc: Oral  PainSc: 0-No pain                 Mariadelcarmen Corella

## 2019-12-28 NOTE — Anesthesia Procedure Notes (Signed)
Procedure Name: LMA Insertion Date/Time: 12/28/2019 10:15 AM Performed by: Jonna Munro, CRNA Pre-anesthesia Checklist: Patient identified, Emergency Drugs available, Suction available, Patient being monitored and Timeout performed Patient Re-evaluated:Patient Re-evaluated prior to induction Oxygen Delivery Method: Circle system utilized Preoxygenation: Pre-oxygenation with 100% oxygen Induction Type: IV induction LMA: LMA inserted LMA Size: 4.0 Number of attempts: 1 Placement Confirmation: positive ETCO2 and breath sounds checked- equal and bilateral Dental Injury: Teeth and Oropharynx as per pre-operative assessment

## 2019-12-28 NOTE — Interval H&P Note (Signed)
History and Physical Interval Note:  12/28/2019 9:44 AM  Leah Lucas  has presented today for surgery, with the diagnosis of Ventral Hernia.  The various methods of treatment have been discussed with the patient and family. After consideration of risks, benefits and other options for treatment, the patient has consented to  Procedure(s): HERNIA REPAIR VENTRAL ADULT (N/A) as a surgical intervention.  The patient's history has been reviewed, patient examined, no change in status, stable for surgery.  I have reviewed the patient's chart and labs.  Questions were answered to the patient's satisfaction.     Aviva Signs

## 2020-01-03 ENCOUNTER — Telehealth: Payer: Self-pay | Admitting: General Surgery

## 2020-01-05 ENCOUNTER — Telehealth: Payer: Self-pay

## 2020-01-05 ENCOUNTER — Ambulatory Visit (INDEPENDENT_AMBULATORY_CARE_PROVIDER_SITE_OTHER): Payer: Self-pay | Admitting: General Surgery

## 2020-01-05 ENCOUNTER — Encounter: Payer: Self-pay | Admitting: General Surgery

## 2020-01-05 ENCOUNTER — Other Ambulatory Visit: Payer: Self-pay

## 2020-01-05 ENCOUNTER — Telehealth: Payer: Self-pay | Admitting: General Surgery

## 2020-01-05 VITALS — BP 138/81 | HR 90 | Temp 97.8°F | Resp 12 | Ht 65.0 in | Wt 198.6 lb

## 2020-01-05 DIAGNOSIS — Z09 Encounter for follow-up examination after completed treatment for conditions other than malignant neoplasm: Secondary | ICD-10-CM

## 2020-01-05 NOTE — Telephone Encounter (Signed)
Return to work note 4/15/21no restrictions  faxed to [employer] [phone].

## 2020-01-05 NOTE — Progress Notes (Signed)
Subjective:     Leah Lucas  Here for postoperative visit.  Patient has mild incisional pain.  No drainage has been noted. Objective:    BP 138/81   Pulse 90   Temp 97.8 F (36.6 C) (Oral)   Resp 12   Ht 5\' 5"  (1.651 m)   Wt 198 lb 9.6 oz (90.1 kg)   LMP 03/26/2014   SpO2 98%   BMI 33.05 kg/m   General:  alert, cooperative and no distress  Lungs are clear to auscultation with equal breath sounds bilaterally Abdomen is soft, incision healing well.     Assessment:    Doing well postoperatively.    Plan:   May gradually increase activity.  No significant heavy lifting right now, but may increase that with time.  May return to work without restrictions on 01/18/2020.  Follow-up here as needed.

## 2020-05-24 ENCOUNTER — Ambulatory Visit
Admission: EM | Admit: 2020-05-24 | Discharge: 2020-05-24 | Disposition: A | Discharge status: Home / Self Care | Payer: 59 | Attending: Emergency Medicine | Admitting: Emergency Medicine

## 2020-05-24 ENCOUNTER — Encounter: Payer: Self-pay | Admitting: Emergency Medicine

## 2020-05-24 DIAGNOSIS — Z113 Encounter for screening for infections with a predominantly sexual mode of transmission: Secondary | ICD-10-CM

## 2020-05-24 NOTE — ED Triage Notes (Signed)
Patient would like to be tested for all std's

## 2020-05-24 NOTE — Discharge Instructions (Signed)
Vaginal self swab or Urine cytology obtained  We will follow up with you regarding the results of your test If tests are positive, please abstain from sexual activity until you and your partner(s) are treated Follow up with PCP or [facility] if symptoms persists Return here or go to ER if you have any new or worsening symptoms

## 2020-05-24 NOTE — ED Provider Notes (Addendum)
[place]   [id number] 05/24/20 Arrival Time: 9417   EY:CXKGYJE FOR STD  SUBJECTIVE:  Leah Lucas is a 54 y.o. female who presents requesting STI screening.  Currently asymptomatic.  Partner asymptomatic.  Last unprotected sexual encounter within weeks.  Sexually active with 1 female partner.  Denies similar symptoms in the past.  Denies fever, chills, nausea, vomiting, abdominal or pelvic pain, urinary symptoms, vaginal itching, vaginal odor, vaginal bleeding, dyspareunia, vaginal rashes or lesions.      Patient's last menstrual period was 03/26/2014.  ROS: As per HPI.  All other pertinent ROS negative.     Past Medical History:  Diagnosis Date  . Hypercholesteremia   . Seasonal allergies    Past Surgical History:  Procedure Laterality Date  . VENTRAL HERNIA REPAIR N/A 12/28/2019   Procedure: HERNIA REPAIR VENTRAL ADULT WITH MESH;  Surgeon: Aviva Signs, MD;  Location: [facility];  Service: General;  Laterality: N/A;   No Known Allergies No current facility-administered medications on file prior to encounter.   Current Outpatient Medications on File Prior to Encounter  Medication Sig Dispense Refill  . loratadine (CLARITIN) 10 MG tablet Take 10 mg by mouth daily as needed for allergies.    . pravastatin (PRAVACHOL) 20 MG tablet Take 20 mg by mouth daily.      Social History   Socioeconomic History  . Marital status: Single    Spouse name: Not on file  . Number of children: Not on file  . Years of education: Not on file  . Highest education level: Not on file  Occupational History  . Not on file  Tobacco Use  . Smoking status: Never Smoker  . Smokeless tobacco: Never Used  Substance and Sexual Activity  . Alcohol use: No    Comment: occ.  . Drug use: No  . Sexual activity: Yes    Birth control/protection: Condom, None  Other Topics Concern  . Not on file  Social History Narrative  . Not on file   Social Determinants of Health   Financial  Resource Strain:   . Difficulty of Paying Living Expenses: Not on file  Food Insecurity:   . Worried About Charity fundraiser in the Last Year: Not on file  . Ran Out of Food in the Last Year: Not on file  Transportation Needs:   . Lack of Transportation (Medical): Not on file  . Lack of Transportation (Non-Medical): Not on file  Physical Activity:   . Days of Exercise per Week: Not on file  . Minutes of Exercise per Session: Not on file  Stress:   . Feeling of Stress : Not on file  Social Connections:   . Frequency of Communication with Friends and Family: Not on file  . Frequency of Social Gatherings with Friends and Family: Not on file  . Attends Religious Services: Not on file  . Active Member of Clubs or Organizations: Not on file  . Attends Archivist Meetings: Not on file  . Marital Status: Not on file  Intimate Partner Violence:   . Fear of Current or Ex-Partner: Not on file  . Emotionally Abused: Not on file  . Physically Abused: Not on file  . Sexually Abused: Not on file   Family History  Problem Relation Age of Onset  . Diabetes Mother   . Heart disease Mother   . Hypertension Mother   . Congestive Heart Failure Mother   . Early death Father   . Hypertension Sister   .  Hypertension Brother   . Heart attack Brother   . Hypertension Maternal Grandmother   . Diabetes Maternal Grandmother   . Hypertension Maternal Grandfather   . Hypertension Sister   . Hypertension Brother   . Diabetes Brother   . Heart attack Sister   . Heart disease Sister   . Congestive Heart Failure Sister   . Diabetes Sister   . Hypertension Sister     OBJECTIVE:  Vitals:   05/24/20 1617  BP: (!) 166/87  Pulse: 64  Resp: 15  Temp: 98 F (36.7 C)  TempSrc: Oral  SpO2: 98%     General appearance: alert, NAD, appears stated age Head: NCAT Throat: lips, mucosa, and tongue normal; teeth and gums normal Lungs: CTA bilaterally without adventitious breath  sounds Heart: regular rate and rhythm.  Radial pulses 2+ symmetrical bilaterally Back: no CVA tenderness Abdomen: soft, non-tender; bowel sounds normal; no masses or organomegaly; no guarding or rebound tenderness GU: deferred, vaginal self swab was completed Skin: warm and dry Psychological:  Alert and cooperative. Normal mood and affect.  LABS:  Results for orders placed or performed during the hospital encounter of 12/26/19  CBC  Result Value Ref Range   WBC 5.4 4.0 - 10.5 K/uL   RBC 5.15 (H) 3.87 - 5.11 MIL/uL   Hemoglobin 14.1 12.0 - 15.0 g/dL   HCT 44.6 36 - 46 %   MCV 86.6 80.0 - 100.0 fL   MCH 27.4 26.0 - 34.0 pg   MCHC 31.6 30.0 - 36.0 g/dL   RDW 13.2 11.5 - 15.5 %   Platelets 422 (H) 150 - 400 K/uL   nRBC 0.0 0.0 - 0.2 %    Labs Reviewed  HIV ANTIBODY (ROUTINE TESTING W REFLEX)  RPR  CERVICOVAGINAL ANCILLARY ONLY    ASSESSMENT & PLAN:  1. Screening for STD (sexually transmitted disease)     No orders of the defined types were placed in this encounter.   Pending: Labs Reviewed  HIV ANTIBODY (ROUTINE TESTING W REFLEX)  RPR  CERVICOVAGINAL ANCILLARY ONLY   Discharge Instructions  Vaginal self swab or Urine cytology obtained  We will follow up with you regarding the results of your test If tests are positive, please abstain from sexual activity until you and your partner(s) are treated Follow up with PCP or [place] if symptoms persists Return here or go to ER if you have any new or worsening symptoms    Reviewed expectations re: course of current medical issues. Questions answered. Outlined signs and symptoms indicating need for more acute intervention. Patient verbalized understanding. After Visit Summary given.    Note: This document was prepared using Dragon voice recognition software and may include unintentional dictation errors.    Emerson Monte, FNP 05/24/20 1707    Emerson Monte, FNP 05/24/20 1708

## 2020-05-25 LAB — CERVICOVAGINAL ANCILLARY ONLY
Bacterial Vaginitis (gardnerella): NEGATIVE
Candida Glabrata: NEGATIVE
Candida Vaginitis: NEGATIVE
Chlamydia: NEGATIVE
Comment: NEGATIVE
Comment: NEGATIVE
Comment: NEGATIVE
Comment: NEGATIVE
Comment: NEGATIVE
Comment: NORMAL
Neisseria Gonorrhea: NEGATIVE
Trichomonas: NEGATIVE

## 2020-05-25 LAB — HIV ANTIBODY (ROUTINE TESTING W REFLEX): HIV Screen 4th Generation wRfx: NONREACTIVE

## 2020-05-25 LAB — RPR: RPR Ser Ql: NONREACTIVE

## 2020-06-07 ENCOUNTER — Ambulatory Visit
Admission: EM | Admit: 2020-06-07 | Discharge: 2020-06-07 | Disposition: A | Discharge status: Home / Self Care | Payer: 59 | Attending: Emergency Medicine | Admitting: Emergency Medicine

## 2020-06-07 ENCOUNTER — Encounter: Payer: Self-pay | Admitting: Emergency Medicine

## 2020-06-07 ENCOUNTER — Other Ambulatory Visit: Payer: Self-pay

## 2020-06-07 DIAGNOSIS — H6011 Cellulitis of right external ear: Secondary | ICD-10-CM

## 2020-06-07 DIAGNOSIS — H9201 Otalgia, right ear: Secondary | ICD-10-CM | POA: Diagnosis not present

## 2020-06-07 MED ORDER — MUPIROCIN 2 % EX OINT: 1.0000 "application " | TOPICAL_OINTMENT | Freq: Two times a day (BID) | CUTANEOUS | 0 refills | Status: DC

## 2020-06-07 MED ORDER — FLUCONAZOLE 150 MG PO TABS: 150.0000 mg | ORAL_TABLET | Freq: Once | ORAL | 0 refills | Status: AC

## 2020-06-07 MED ORDER — DOXYCYCLINE HYCLATE 100 MG PO CAPS: 100.0000 mg | ORAL_CAPSULE | Freq: Two times a day (BID) | ORAL | 0 refills | Status: DC

## 2020-06-07 NOTE — ED Provider Notes (Signed)
[place]   [id number] 06/07/20 Arrival Time: 0917   Chief Complaint  Patient presents with  . Wound Check     SUBJECTIVE: History from: patient.  Leah Lucas is a 54 y.o. female who presented to the urgent care for complaint of swelling and scab on right ear tragus for the past 1 day.  Denies any precipitating event.  Localized pain, tenderness and swelling to right ear tragus.  She describes the pain as constant and achy.  She has tried OTC medications without relief.  Denies similar symptoms in the past.  Denies chills, fever, nausea, vomiting, diarrhea ROS: As per HPI.  All other pertinent ROS negative.              Past Medical History:  Diagnosis Date  . Hypercholesteremia   . Seasonal allergies    Past Surgical History:  Procedure Laterality Date  . VENTRAL HERNIA REPAIR N/A 12/28/2019   Procedure: HERNIA REPAIR VENTRAL ADULT WITH MESH;  Surgeon: Aviva Signs, MD;  Location: [facility];  Service: General;  Laterality: N/A;   No Known Allergies No current facility-administered medications on file prior to encounter.   Current Outpatient Medications on File Prior to Encounter  Medication Sig Dispense Refill  . loratadine (CLARITIN) 10 MG tablet Take 10 mg by mouth daily as needed for allergies.    . pravastatin (PRAVACHOL) 20 MG tablet Take 20 mg by mouth daily.      Social History   Socioeconomic History  . Marital status: Single    Spouse name: Not on file  . Number of children: Not on file  . Years of education: Not on file  . Highest education level: Not on file  Occupational History  . Not on file  Tobacco Use  . Smoking status: Never Smoker  . Smokeless tobacco: Never Used  Substance and Sexual Activity  . Alcohol use: No    Comment: occ.  . Drug use: No  . Sexual activity: Yes    Birth control/protection: Condom, None  Other Topics Concern  . Not on file  Social History Narrative  . Not on file   Social Determinants of Health    Financial Resource Strain:   . Difficulty of Paying Living Expenses: Not on file  Food Insecurity:   . Worried About Charity fundraiser in the Last Year: Not on file  . Ran Out of Food in the Last Year: Not on file  Transportation Needs:   . Lack of Transportation (Medical): Not on file  . Lack of Transportation (Non-Medical): Not on file  Physical Activity:   . Days of Exercise per Week: Not on file  . Minutes of Exercise per Session: Not on file  Stress:   . Feeling of Stress : Not on file  Social Connections:   . Frequency of Communication with Friends and Family: Not on file  . Frequency of Social Gatherings with Friends and Family: Not on file  . Attends Religious Services: Not on file  . Active Member of Clubs or Organizations: Not on file  . Attends Archivist Meetings: Not on file  . Marital Status: Not on file  Intimate Partner Violence:   . Fear of Current or Ex-Partner: Not on file  . Emotionally Abused: Not on file  . Physically Abused: Not on file  . Sexually Abused: Not on file   Family History  Problem Relation Age of Onset  . Diabetes Mother   . Heart disease Mother   .  Hypertension Mother   . Congestive Heart Failure Mother   . Early death Father   . Hypertension Sister   . Hypertension Brother   . Heart attack Brother   . Hypertension Maternal Grandmother   . Diabetes Maternal Grandmother   . Hypertension Maternal Grandfather   . Hypertension Sister   . Hypertension Brother   . Diabetes Brother   . Heart attack Sister   . Heart disease Sister   . Congestive Heart Failure Sister   . Diabetes Sister   . Hypertension Sister     OBJECTIVE:  Vitals:   06/07/20 0937  BP: 134/89  Pulse: 69  Resp: 16  Temp: 98.1 F (36.7 C)  TempSrc: Oral  SpO2: 100%     Physical Exam Vitals and nursing note reviewed.  Constitutional:      General: She is not in acute distress.    Appearance: Normal appearance. She is normal weight. She is not  ill-appearing, toxic-appearing or diaphoretic.  HENT:     Head: Normocephalic.     Right Ear: Tympanic membrane and ear canal normal. There is impacted cerumen.     Left Ear: Tympanic membrane, ear canal and external ear normal. There is impacted cerumen.     Ears:     Comments: Swelling and tenderness associated with right ear tragus.  Black scab present Cardiovascular:     Rate and Rhythm: Normal rate and regular rhythm.     Pulses: Normal pulses.     Heart sounds: Normal heart sounds. No murmur heard.  No friction rub. No gallop.   Pulmonary:     Effort: Pulmonary effort is normal. No respiratory distress.     Breath sounds: Normal breath sounds. No stridor. No wheezing, rhonchi or rales.  Chest:     Chest wall: No tenderness.  Neurological:     Mental Status: She is alert and oriented to person, place, and time.      LABS:  No results found for this or any previous visit (from the past 24 hour(s)).   ASSESSMENT & PLAN:  1. Ear pain, right   2. Cellulitis of tragus of right ear     Meds ordered this encounter  Medications  . doxycycline (VIBRAMYCIN) 100 MG capsule    Sig: Take 1 capsule (100 mg total) by mouth 2 (two) times daily.    Dispense:  20 capsule    Refill:  0  . mupirocin ointment (BACTROBAN) 2 %    Sig: Apply 1 application topically 2 (two) times daily.    Dispense:  22 g    Refill:  0    Discharge Instructions.Marland Kitchen  Keep it clean with water and mild soap Apply thin layer of Bactroban daily Take medication as prescribed and to completion Follow-up with PCP Return to to ED for worsening  Reviewed expectations re: course of current medical issues. Questions answered. Outlined signs and symptoms indicating need for more acute intervention. Patient verbalized understanding. After Visit Summary given.     Note: This document was prepared using Dragon voice recognition software and may include unintentional dictation errors.     Emerson Monte,  FNP 06/07/20 1037

## 2020-06-07 NOTE — ED Triage Notes (Signed)
Sore on RT ear x 1 day

## 2020-06-07 NOTE — Discharge Instructions (Signed)
Keep it clean with water and mild soap Apply thin layer of Bactroban daily Take medication as prescribed and to completion Follow-up with PCP Return to to ED for worsening

## 2020-06-14 ENCOUNTER — Other Ambulatory Visit (HOSPITAL_COMMUNITY)
Admission: RE | Admit: 2020-06-14 | Discharge: 2020-06-14 | Disposition: A | Discharge status: Home / Self Care | Payer: 59 | Source: Ambulatory Visit | Attending: Adult Health | Admitting: Adult Health

## 2020-06-14 ENCOUNTER — Encounter: Payer: Self-pay | Admitting: Adult Health

## 2020-06-14 ENCOUNTER — Ambulatory Visit (INDEPENDENT_AMBULATORY_CARE_PROVIDER_SITE_OTHER): Payer: 59 | Admitting: Adult Health

## 2020-06-14 VITALS — BP 149/93 | HR 69 | Ht 65.0 in | Wt 197.0 lb

## 2020-06-14 DIAGNOSIS — N898 Other specified noninflammatory disorders of vagina: Secondary | ICD-10-CM | POA: Diagnosis not present

## 2020-06-14 DIAGNOSIS — Z113 Encounter for screening for infections with a predominantly sexual mode of transmission: Secondary | ICD-10-CM | POA: Insufficient documentation

## 2020-06-14 DIAGNOSIS — N950 Postmenopausal bleeding: Secondary | ICD-10-CM | POA: Insufficient documentation

## 2020-06-14 NOTE — Progress Notes (Signed)
  Subjective:     Patient ID: Leah Lucas, female   DOB: 09-24-1966, 54 y.o.   MRN: [id number]  HPI Leah Lucas is a 54 year old black female,single,PM in complaining of vaginal discharge, no odor, itching or burning. Stopped period las February and then had steroid injection and started spotting in last month and is spotting black today. She was seen at [facility] and had labs and [place] and pap. She says last partner had lots of other women. She had negative HIV and RPR 05/24/20. PCP is Dr Nevada Crane   Review of Systems Vaginal discharge PMB No sex since August Reviewed past medical,surgical, social and family history. Reviewed medications and allergies.     Objective:   Physical Exam BP (!) 149/93 (BP Location: Left Arm, Patient Position: Sitting, Cuff Size: Normal)   Pulse 69   Ht 5\' 5"  (1.651 m)   Wt 197 lb (89.4 kg)   LMP 11/26/2018 (Approximate) Comment: spotted in july and now   BMI 32.78 kg/m   Skin warm and dry.Pelvic: external genitalia is normal in appearance no lesions, vagina: dark brown to black discharge without odor,urethra has no lesions or masses noted, cervix:smooth and bulbous, uterus: normal size, shape and contour, non tender, no masses felt, adnexa: no masses or tenderness noted. Bladder is non tender and no masses felt.CV swab obtained.  Examination chaperoned by Dwyane Dee LPN AA is 1 Fall risk is low PHQ 9 score is 0   [place] - 06/14/20 1119      Pregnancy Intention Screening   Does the patient want to become pregnant in the next year? No   postmenopausal   Does the patient's partner want to become pregnant in the next year? N/A    Would the patient like to discuss contraceptive options today? No   postmenopausal     Contraception Wrap Up   Current Method No Method - Other Reason   PM   End Method No Method - Other Reason   post menopausal   Contraception Counseling Provided No             Assessment:     1. Vaginal discharge CV swab sent   2. PMB  (postmenopausal bleeding) Request records for [place], pap and labs from [place]   3. Screening examination for STD (sexually transmitted disease) CV swab sent Check HSV 1&2 antibodies and Hepatitis C antibody     Plan:     Request records and follow up in about 4 weeks    Recheck HIV and RPR in November

## 2020-06-15 LAB — CERVICOVAGINAL ANCILLARY ONLY
Bacterial Vaginitis (gardnerella): NEGATIVE
Candida Glabrata: NEGATIVE
Candida Vaginitis: NEGATIVE
Chlamydia: NEGATIVE
Comment: NEGATIVE
Comment: NEGATIVE
Comment: NEGATIVE
Comment: NEGATIVE
Comment: NEGATIVE
Comment: NORMAL
Neisseria Gonorrhea: NEGATIVE
Trichomonas: NEGATIVE

## 2020-06-15 LAB — HSV 1 ANTIBODY, IGG: HSV 1 Glycoprotein G Ab, IgG: 38.8 index — ABNORMAL HIGH (ref 0.00–0.90)

## 2020-06-15 LAB — FOLLICLE STIMULATING HORMONE: FSH: 17.5 m[IU]/mL

## 2020-06-15 LAB — HEPATITIS C ANTIBODY: Hep C Virus Ab: <0.1 s/co ratio (ref 0.0–0.9)

## 2020-06-15 LAB — HSV 2 ANTIBODY, IGG: HSV 2 IgG, Type Spec: <0.91 index (ref 0.00–0.90)

## 2020-06-19 ENCOUNTER — Telehealth: Payer: Self-pay | Admitting: Adult Health

## 2020-06-19 DIAGNOSIS — Z1211 Encounter for screening for malignant neoplasm of colon: Secondary | ICD-10-CM

## 2020-06-19 NOTE — Telephone Encounter (Signed)
Patient has questions regarding bloodwork.

## 2020-06-20 ENCOUNTER — Telehealth: Payer: Self-pay

## 2020-06-20 ENCOUNTER — Ambulatory Visit
Admission: EM | Admit: 2020-06-20 | Discharge: 2020-06-20 | Disposition: A | Discharge status: Home / Self Care | Payer: 59 | Attending: Emergency Medicine | Admitting: Emergency Medicine

## 2020-06-20 ENCOUNTER — Other Ambulatory Visit: Payer: Self-pay

## 2020-06-20 ENCOUNTER — Ambulatory Visit: Admission: EM | Admit: 2020-06-20 | Discharge: 2020-06-20 | Discharge status: Absent without leave | Payer: Self-pay

## 2020-06-20 DIAGNOSIS — K644 Residual hemorrhoidal skin tags: Secondary | ICD-10-CM | POA: Diagnosis not present

## 2020-06-20 MED ORDER — HYDROCORTISONE (PERIANAL) 2.5 % EX CREA: 1.0000 "application " | TOPICAL_CREAM | Freq: Two times a day (BID) | CUTANEOUS | 0 refills | Status: DC

## 2020-06-20 MED ORDER — BENZOCAINE 20 % RE OINT: TOPICAL_OINTMENT | RECTAL | 0 refills | Status: DC | PRN

## 2020-06-20 NOTE — Addendum Note (Signed)
Addended by: Derrek Monaco A on: 06/20/2020 12:34 PM   Modules accepted: Orders

## 2020-06-20 NOTE — Telephone Encounter (Signed)
Pt aware of labs, HSV 1+ Frank normal not PM.repeat US in 3 months will refer to Dr Laural Golden for colonoscopy, has had rectal bleeding at times ?hemorrhoid had burning too.

## 2020-06-20 NOTE — ED Triage Notes (Signed)
Pt seen by provider prior to RN see provider note

## 2020-06-20 NOTE — ED Provider Notes (Signed)
[place]   [id number] 06/20/20 Arrival Time: 1532   Chief Complaint  Patient presents with  . Hemorrhoids     SUBJECTIVE: History from: patient.  Leah Lucas is a 54 y.o. female who presented to the urgent care with a complaint of bowel movement with blood  for the past 3 days.  Denies a precipitating event, or specific injury.  Report red blood color while wiping herself.  Does not show any OTC medication.  Symptoms are made worse with bowel movement.  Denies similar symptoms in the past.  Denies fever, chills, appetite change, weight change, chest pain, nausea, vomiting, changes in bowel or bladder habits.   ROS: As per HPI.  All other pertinent ROS negative.     Past Medical History:  Diagnosis Date  . Hypercholesteremia   . Seasonal allergies    Past Surgical History:  Procedure Laterality Date  . VENTRAL HERNIA REPAIR N/A 12/28/2019   Procedure: HERNIA REPAIR VENTRAL ADULT WITH MESH;  Surgeon: Aviva Signs, MD;  Location: [facility];  Service: General;  Laterality: N/A;   No Known Allergies No current facility-administered medications on file prior to encounter.   Current Outpatient Medications on File Prior to Encounter  Medication Sig Dispense Refill  . alclomethasone (ACLOVATE) 0.05 % cream Apply topically 2 (two) times daily as needed.    . doxycycline (VIBRAMYCIN) 100 MG capsule Take 1 capsule (100 mg total) by mouth 2 (two) times daily. 20 capsule 0  . loratadine (CLARITIN) 10 MG tablet Take 10 mg by mouth daily as needed for allergies.    . mupirocin ointment (BACTROBAN) 2 % Apply 1 application topically 2 (two) times daily. 22 g 0  . pravastatin (PRAVACHOL) 20 MG tablet Take 20 mg by mouth daily.      Social History   Socioeconomic History  . Marital status: Single    Spouse name: Not on file  . Number of children: Not on file  . Years of education: Not on file  . Highest education level: Not on file  Occupational History  . Not on file    Tobacco Use  . Smoking status: Never Smoker  . Smokeless tobacco: Never Used  Vaping Use  . Vaping Use: Never used  Substance and Sexual Activity  . Alcohol use: Yes    Comment: occ.  . Drug use: No  . Sexual activity: Not Currently    Birth control/protection: None, Post-menopausal  Other Topics Concern  . Not on file  Social History Narrative  . Not on file   Social Determinants of Health   Financial Resource Strain: Low Risk   . Difficulty of Paying Living Expenses: Not hard at all  Food Insecurity: No Food Insecurity  . Worried About Charity fundraiser in the Last Year: Never true  . Ran Out of Food in the Last Year: Never true  Transportation Needs: No Transportation Needs  . Lack of Transportation (Medical): No  . Lack of Transportation (Non-Medical): No  Physical Activity: Insufficiently Active  . Days of Exercise per Week: 2 days  . Minutes of Exercise per Session: 30 min  Stress: No Stress Concern Present  . Feeling of Stress : Not at all  Social Connections: Moderately Integrated  . Frequency of Communication with Friends and Family: More than three times a week  . Frequency of Social Gatherings with Friends and Family: Three times a week  . Attends Religious Services: More than 4 times per year  . Active Member of  Clubs or Organizations: Yes  . Attends Archivist Meetings: 1 to 4 times per year  . Marital Status: Never married  Intimate Partner Violence: Not At Risk  . Fear of Current or Ex-Partner: No  . Emotionally Abused: No  . Physically Abused: No  . Sexually Abused: No   Family History  Problem Relation Age of Onset  . Diabetes Mother   . Heart disease Mother   . Hypertension Mother   . Congestive Heart Failure Mother   . Early death Father   . Hypertension Sister   . Hypertension Brother   . Heart attack Brother   . Hypertension Maternal Grandmother   . Diabetes Maternal Grandmother   . Hypertension Maternal Grandfather   .  Hypertension Sister   . Hypertension Brother   . Diabetes Brother   . Heart attack Sister   . Heart disease Sister   . Congestive Heart Failure Sister   . Diabetes Sister   . Hypertension Sister   . Colon cancer Sister     OBJECTIVE:  Vitals:   06/20/20 1648  BP: 136/86  Pulse: 75  Resp: 16  Temp: 98.3 F (36.8 C)  SpO2: 98%     Physical Exam Vitals and nursing note reviewed. Exam conducted with a chaperone present.  Constitutional:      General: She is not in acute distress.    Appearance: Normal appearance. She is normal weight. She is not ill-appearing or toxic-appearing.  Cardiovascular:     Rate and Rhythm: Normal rate and regular rhythm.     Pulses: Normal pulses.     Heart sounds: Normal heart sounds. No murmur heard.  No friction rub. No gallop.   Pulmonary:     Effort: Pulmonary effort is normal. No respiratory distress.     Breath sounds: Normal breath sounds. No stridor. No wheezing, rhonchi or rales.  Chest:     Chest wall: No tenderness.  Abdominal:     General: Abdomen is flat. Bowel sounds are normal. There is no distension.     Palpations: There is no mass.     Tenderness: There is no abdominal tenderness. There is no right CVA tenderness, left CVA tenderness, guarding or rebound. Negative signs include McBurney's sign.     Hernia: No hernia is present.  Genitourinary:    Comments: Single external hemorrhoid present at the opening of the anus, tender to touch Neurological:     Mental Status: She is alert and oriented to person, place, and time.      LABS:  No results found for this or any previous visit (from the past 24 hour(s)).   ASSESSMENT & PLAN:  1. External hemorrhoid     Meds ordered this encounter  Medications  . benzocaine (AMERICAINE) 20 % rectal ointment    Sig: Place rectally every 3 (three) hours as needed for pain.    Dispense:  28.4 g    Refill:  0  . hydrocortisone (ANUSOL-HC) 2.5 % rectal cream    Sig: Place 1  application rectally 2 (two) times daily.    Dispense:  30 g    Refill:  0   Discharge instructions  Perform sitz water baths Eat a diet high if fiber and drink plenty of water Prescribed benzocaine and Anusol Use medication as prescribed for symptomatic relief Follow up with PCP if symptoms persists Return or go to the ER if you have any new or worsening symptoms    Reviewed expectations re: course of  current medical issues. Questions answered. Outlined signs and symptoms indicating need for more acute intervention. Patient verbalized understanding. After Visit Summary given.         Emerson Monte, [place] 06/20/20 1734

## 2020-06-20 NOTE — Telephone Encounter (Signed)
New message    The patient needs a referral & left a message on yesterday and did not hear back regarding blood work.   GI dept   Physician Dr. Laural Golden  Reason: colonoscopy

## 2020-06-20 NOTE — Discharge Instructions (Addendum)
Perform sitz water baths Eat a diet high if fiber and drink plenty of water Prescribed benzocaine and Anusol Use medication as prescribed for symptomatic relief Follow up with PCP if symptoms persists Return or go to the ER if you have any new or worsening symptoms

## 2020-07-12 ENCOUNTER — Encounter: Payer: Self-pay | Admitting: Adult Health

## 2020-07-12 ENCOUNTER — Other Ambulatory Visit: Payer: Self-pay

## 2020-07-12 ENCOUNTER — Ambulatory Visit (INDEPENDENT_AMBULATORY_CARE_PROVIDER_SITE_OTHER): Payer: 59 | Admitting: Adult Health

## 2020-07-12 VITALS — BP 146/84 | HR 67 | Ht 65.0 in | Wt 198.0 lb

## 2020-07-12 DIAGNOSIS — B009 Herpesviral infection, unspecified: Secondary | ICD-10-CM | POA: Diagnosis not present

## 2020-07-12 DIAGNOSIS — N939 Abnormal uterine and vaginal bleeding, unspecified: Secondary | ICD-10-CM | POA: Diagnosis not present

## 2020-07-12 NOTE — Progress Notes (Signed)
  Subjective:     Patient ID: Leah Lucas, female   DOB: 01/04/1966, 54 y.o.   MRN: [id number]  HPI Lumina is a 54 year old black female,single, G1P1, not PM FSH 17.5 back in follow up on bleeding that started in July after steroid injection, still bleeding some, had [place] at [facility]. PCP is Dr Nevada Crane  Review of Systems Bleeding since July was spotting, now heavier Reviewed past medical,surgical, social and family history. Reviewed medications and allergies.     Objective:   Physical Exam BP (!) 146/84 (BP Location: Left Arm, Patient Position: Sitting, Cuff Size: Normal)   Pulse 67   Ht 5\' 5"  (1.651 m)   Wt 198 lb (89.8 kg)   LMP 11/26/2018 (Approximate) Comment: spotted in july and now   BMI 32.95 kg/m  Skin warm and dry.  Lungs: clear to ausculation bilaterally. Cardiovascular: regular rate and rhythm.    Assessment:     1. Vaginal bleeding Will get GYN [place], first available  Discussed that she is not PM, use condoms    2. HSV,type 1  Discussed HSV in detail with her  Plan:     Will get Follow up GYN [place] and see me 3-4 days later  Check HIV and RPR in November    Face time 30 minutes explaining to pt. HSV type 1 and that she is not PM

## 2020-07-19 ENCOUNTER — Encounter (INDEPENDENT_AMBULATORY_CARE_PROVIDER_SITE_OTHER): Payer: Self-pay | Admitting: Gastroenterology

## 2020-07-19 ENCOUNTER — Ambulatory Visit (INDEPENDENT_AMBULATORY_CARE_PROVIDER_SITE_OTHER): Payer: 59 | Admitting: Gastroenterology

## 2020-07-19 ENCOUNTER — Encounter (INDEPENDENT_AMBULATORY_CARE_PROVIDER_SITE_OTHER): Payer: Self-pay | Admitting: *Deleted

## 2020-07-19 ENCOUNTER — Other Ambulatory Visit: Payer: Self-pay

## 2020-07-19 ENCOUNTER — Other Ambulatory Visit (INDEPENDENT_AMBULATORY_CARE_PROVIDER_SITE_OTHER): Payer: Self-pay | Admitting: *Deleted

## 2020-07-19 VITALS — BP 143/91 | HR 80 | Temp 96.8°F | Ht 65.0 in | Wt 198.4 lb

## 2020-07-19 DIAGNOSIS — K625 Hemorrhage of anus and rectum: Secondary | ICD-10-CM

## 2020-07-19 NOTE — Patient Instructions (Signed)
Try fiber supplement such Benefiber and Citrucel and make sure you are getting adequate fluid intake. We are scheduling colonoscopy.

## 2020-07-19 NOTE — Progress Notes (Signed)
Patient profile: Leah Lucas is a 54 y.o. female seen for evaluation of rectal bleeding and colon cancer screening.  History of Present Illness: Leah Lucas is seen today for evaluation. She reports she seeing a very small amount of blood on toliet tissue with wiping. She was evaluated in urgent care and diagnosed with hemorrhoids 06/2020. She was given Anusol cream and the bleeding has resolved. Feels and reports that bleeding started after she stopped on a hard bleacher service watching football game. She has not seen bleeding over the past month. She generally has a bowel movement daily without straining but does have occasional hard stools. She denies any abdominal pain. No rectal pain with the bleeding.  She denies any GERD symptoms, nausea, vomiting, epigastric pain. She had a ventral hernia repair with Dr. Arnoldo Morale last year.    Wt Readings from Last 3 Encounters:  07/19/20 198 lb 6.4 oz (90 kg)  07/12/20 198 lb (89.8 kg)  06/14/20 197 lb (89.4 kg)     Last Colonoscopy: none prior  Last Endoscopy: none prior    Past Medical History:  Past Medical History:  Diagnosis Date  . Hypercholesteremia   . Seasonal allergies     Problem List: Patient Active Problem List   Diagnosis Date Noted  . Vaginal bleeding 07/12/2020  . 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  . Screening examination for STD (sexually transmitted disease) 06/14/2020  . PMB (postmenopausal bleeding) 06/14/2020  . Vaginal discharge 06/14/2020  . Ventral hernia without obstruction or gangrene   . Amenorrhea 05/18/2014    Past Surgical History: Past Surgical History:  Procedure Laterality Date  . VENTRAL HERNIA REPAIR N/A 12/28/2019   Procedure: HERNIA REPAIR VENTRAL ADULT WITH MESH;  Surgeon: Aviva Signs, MD;  Location: [facility];  Service: General;  Laterality: N/A;    Allergies: No Known Allergies    Home Medications:  Current Outpatient Medications:  .  alclomethasone  (ACLOVATE) 0.05 % cream, Apply topically 2 (two) times daily as needed., Disp: , Rfl:  .  benzocaine (AMERICAINE) 20 % rectal ointment, Place rectally every 3 (three) hours as needed for pain., Disp: 28.4 g, Rfl: 0 .  loratadine (CLARITIN) 10 MG tablet, Take 10 mg by mouth daily as needed for allergies., Disp: , Rfl:  .  pravastatin (PRAVACHOL) 20 MG tablet, Take 20 mg by mouth daily. , Disp: , Rfl:  .  doxycycline (VIBRAMYCIN) 100 MG capsule, Take 1 capsule (100 mg total) by mouth 2 (two) times daily. (Patient not taking: Reported on 07/12/2020), Disp: 20 capsule, Rfl: 0   Family History: family history includes Colon cancer in her sister; Congestive Heart Failure in her mother and sister; Diabetes in her brother, maternal grandmother, mother, and sister; Early death in her father; Heart attack in her brother and sister; Heart disease in her mother and sister; Hypertension in her brother, brother, maternal grandfather, maternal grandmother, mother, sister, sister, and sister.    Social History:   reports that she has never smoked. She has never used smokeless tobacco. She reports current alcohol use. She reports that she does not use drugs.   Review of Systems: Constitutional: Denies weight loss/weight gain  Eyes: No changes in vision. ENT: No oral lesions, sore throat.  GI: see HPI.  Heme/Lymph: No easy bruising.  CV: No chest pain.  GU: No hematuria.  Integumentary: No rashes.  Neuro: No headaches.  Psych: No depression/anxiety.  Endocrine: No heat/cold intolerance.  Allergic/Immunologic: No urticaria.  Resp: No cough, SOB.  Musculoskeletal: No joint swelling.    Physical Examination: BP (!) 143/91 (BP Location: Right Arm, Patient Position: Sitting, Cuff Size: Normal)   Pulse 80   Temp (!) 96.8 F (36 C) (Temporal)   Ht 5\' 5"  (1.651 m)   Wt 198 lb 6.4 oz (90 kg)   BMI 33.02 kg/m  Gen: NAD, alert and oriented x 4 HEENT: PEERLA, EOMI, Neck: supple, no JVD Chest: CTA  bilaterally, no wheezes, crackles, or other adventitious sounds CV: RRR, no m/g/c/r Abd: soft, NT, ND, +BS in all four quadrants; no HSM, guarding, ridigity, or rebound tenderness Ext: no edema, well perfused with 2+ pulses, Skin: no rash or lesions noted on observed skin Lymph: no noted LAD  Data Reviewed:  05/2020-negative HCV antibody, elevated HSV 1 IgG  12/2019-CBC normal except platelet 422    Assessment/Plan: Leah Lucas is a 54 y.o. female seen for evaluation of rectal bleed   1. Rectal bleeding-small amount that completely resolved with Anusol cream and has not returned. Suspect anal outlet. She did have some mild constipation discussed adequate fluid intake and starting fiber supplement.  2. Colon cancer screening-none prior & sister passed away at age 91 with colon cancer. Needs colonoscopy for evaluation.  Patient denies CP, SOB, and use of blood thinners. I discussed the risks and benefits of procedure including bleeding, perforation, infection, missed lesions, medication reactions and possible hospitalization or surgery if complications. All questions answered. Denies prior issues with sedation  Leah Lucas was seen today for hemorrhoids.  Diagnoses and all orders for this visit:  Rectal bleeding       I personally performed the service, non-incident to. (WP)  Laurine Blazer, [facility]

## 2020-07-23 ENCOUNTER — Telehealth: Payer: Self-pay

## 2020-07-23 ENCOUNTER — Other Ambulatory Visit: Payer: Self-pay | Admitting: Women's Health

## 2020-07-23 MED ORDER — FLUCONAZOLE 150 MG PO TABS: 150.0000 mg | ORAL_TABLET | Freq: Once | ORAL | 0 refills | Status: AC

## 2020-07-23 NOTE — Telephone Encounter (Signed)
Pt aware Diflucan was sent to pharmacy. If no better, needs appt. Pt voiced understanding. [place]

## 2020-07-23 NOTE — Telephone Encounter (Signed)
Pt requesting cream for vaginal irritation. Pt stated she shaved and it got worse after she shaved. She is wanting something called in or an idea of something that she can buy OTC. She stated when she was in the office on 10/07 that it was slightly irritated but did not mention it during the visit.

## 2020-07-23 NOTE — Telephone Encounter (Signed)
Pt feels like A & D ointment will burn and is requesting Diflucan. Please advise. Thanks!! [place]

## 2020-07-23 NOTE — Telephone Encounter (Signed)
Pt feels like it's from sanitary pads or razor. She is not sexually active. Can you recommend something OTC to try? Thanks!! [place]

## 2020-07-25 ENCOUNTER — Other Ambulatory Visit: Payer: Self-pay

## 2020-07-25 ENCOUNTER — Other Ambulatory Visit (HOSPITAL_COMMUNITY)
Admission: RE | Admit: 2020-07-25 | Discharge: 2020-07-25 | Disposition: A | Discharge status: Home / Self Care | Payer: 59 | Source: Ambulatory Visit | Attending: Internal Medicine | Admitting: Internal Medicine

## 2020-07-25 ENCOUNTER — Telehealth (INDEPENDENT_AMBULATORY_CARE_PROVIDER_SITE_OTHER): Payer: Self-pay | Admitting: *Deleted

## 2020-07-25 DIAGNOSIS — Z01812 Encounter for preprocedural laboratory examination: Secondary | ICD-10-CM | POA: Insufficient documentation

## 2020-07-25 DIAGNOSIS — Z20822 Contact with and (suspected) exposure to covid-19: Secondary | ICD-10-CM | POA: Diagnosis not present

## 2020-07-25 LAB — SARS CORONAVIRUS 2 (TAT 6-24 HRS): SARS Coronavirus 2: NEGATIVE

## 2020-07-25 NOTE — Telephone Encounter (Signed)
Ive tried to reach her by phone without having any success

## 2020-07-25 NOTE — Telephone Encounter (Signed)
Patient stopped by office - wants to know if you can call in something for hemorrhoids - [facility] [place] -

## 2020-07-25 NOTE — Telephone Encounter (Signed)
Leah Lucas - Anusol cream has worked well for her in past, can you find out if she just needs a refill of this or feels she needs something different? Thanks.

## 2020-07-26 ENCOUNTER — Encounter (HOSPITAL_COMMUNITY): Payer: Self-pay | Admitting: Internal Medicine

## 2020-07-26 ENCOUNTER — Other Ambulatory Visit: Payer: Self-pay

## 2020-07-26 ENCOUNTER — Encounter (HOSPITAL_COMMUNITY)
Admission: RE | Disposition: A | Discharge status: Home / Self Care | Payer: Self-pay | Source: Home / Self Care | Attending: Internal Medicine

## 2020-07-26 ENCOUNTER — Ambulatory Visit (HOSPITAL_COMMUNITY)
Admission: RE | Admit: 2020-07-26 | Discharge: 2020-07-26 | Disposition: A | Discharge status: Home / Self Care | Payer: 59 | Attending: Internal Medicine | Admitting: Internal Medicine

## 2020-07-26 DIAGNOSIS — Z800 Family history of malignant neoplasm of digestive organs: Secondary | ICD-10-CM | POA: Diagnosis not present

## 2020-07-26 DIAGNOSIS — E7800 Pure hypercholesterolemia, unspecified: Secondary | ICD-10-CM | POA: Insufficient documentation

## 2020-07-26 DIAGNOSIS — K644 Residual hemorrhoidal skin tags: Secondary | ICD-10-CM | POA: Diagnosis not present

## 2020-07-26 DIAGNOSIS — Z79899 Other long term (current) drug therapy: Secondary | ICD-10-CM | POA: Diagnosis not present

## 2020-07-26 DIAGNOSIS — Z8249 Family history of ischemic heart disease and other diseases of the circulatory system: Secondary | ICD-10-CM | POA: Insufficient documentation

## 2020-07-26 DIAGNOSIS — K6289 Other specified diseases of anus and rectum: Secondary | ICD-10-CM

## 2020-07-26 DIAGNOSIS — Z1211 Encounter for screening for malignant neoplasm of colon: Secondary | ICD-10-CM | POA: Insufficient documentation

## 2020-07-26 DIAGNOSIS — K5730 Diverticulosis of large intestine without perforation or abscess without bleeding: Secondary | ICD-10-CM | POA: Diagnosis not present

## 2020-07-26 DIAGNOSIS — K625 Hemorrhage of anus and rectum: Secondary | ICD-10-CM

## 2020-07-26 HISTORY — PX: COLONOSCOPY: SHX5424

## 2020-07-26 LAB — HM COLONOSCOPY

## 2020-07-26 SURGERY — COLONOSCOPY
Anesthesia: Moderate Sedation

## 2020-07-26 MED ORDER — MEPERIDINE HCL 50 MG/ML IJ SOLN
INTRAMUSCULAR | Status: AC
  Filled 2020-07-26: qty 1

## 2020-07-26 MED ORDER — MEPERIDINE HCL 50 MG/ML IJ SOLN
INTRAMUSCULAR | Status: DC | PRN
Start: 2020-07-26 — End: 2020-07-26
  Administered 2020-07-26 (×2): 25 mg via INTRAVENOUS

## 2020-07-26 MED ORDER — MIDAZOLAM HCL 5 MG/5ML IJ SOLN
INTRAMUSCULAR | Status: DC | PRN
  Administered 2020-07-26: 2 mg via INTRAVENOUS
  Administered 2020-07-26: 1 mg via INTRAVENOUS
  Administered 2020-07-26 (×2): 2 mg via INTRAVENOUS

## 2020-07-26 MED ORDER — HYDROCORTISONE 1 % EX CREA: 1.0000 "application " | TOPICAL_CREAM | Freq: Two times a day (BID) | CUTANEOUS | 0 refills | Status: DC

## 2020-07-26 MED ORDER — STERILE WATER FOR IRRIGATION IR SOLN
Status: DC | PRN
  Administered 2020-07-26: 1.5 mL

## 2020-07-26 MED ORDER — LIDOCAINE HCL URETHRAL/MUCOSAL 2 % EX GEL
CUTANEOUS | Status: AC
  Filled 2020-07-26: qty 30

## 2020-07-26 MED ORDER — HYDROCORT-PRAMOXINE (PERIANAL) 2.5-1 % EX CREA: 1.0000 "application " | TOPICAL_CREAM | Freq: Two times a day (BID) | CUTANEOUS | 1 refills | Status: DC

## 2020-07-26 MED ORDER — MIDAZOLAM HCL 5 MG/5ML IJ SOLN
INTRAMUSCULAR | Status: AC
  Filled 2020-07-26: qty 10

## 2020-07-26 MED ORDER — SODIUM CHLORIDE 0.9 % IV SOLN: INTRAVENOUS | Status: DC

## 2020-07-26 NOTE — Op Note (Signed)
[facility] Patient Name: Leah Lucas Procedure Date: 07/26/2020 11:03 AM MRN: [id number] Date of Birth: [birth year] Attending MD: Hildred Laser , MD CSN: [id number] Age: 54 Admit Type: Outpatient Procedure:                Colonoscopy Indications:              Screening in patient at increased risk: Colorectal                            cancer in sister before age 4 Providers:                Hildred Laser, MD, Lambert Mody, Lorin Picket, Technician Referring MD:             Delphina Cahill, MD Medicines:                Meperidine 50 mg IV, Midazolam 7 mg IV Complications:            No immediate complications. Estimated Blood Loss:     Estimated blood loss: none. Procedure:                Pre-Anesthesia Assessment:                           - Prior to the procedure, a History and Physical                            was performed, and patient medications and                            allergies were reviewed. The patient's tolerance of                            previous anesthesia was also reviewed. The risks                            and benefits of the procedure and the sedation                            options and risks were discussed with the patient.                            All questions were answered, and informed consent                            was obtained. Prior Anticoagulants: The patient has                            taken no previous anticoagulant or antiplatelet                            agents. ASA Grade Assessment: I - A normal, healthy  patient. After reviewing the risks and benefits,                            the patient was deemed in satisfactory condition to                            undergo the procedure.                           After obtaining informed consent, the colonoscope                            was passed under direct vision. Throughout the                             procedure, the patient's blood pressure, pulse, and                            oxygen saturations were monitored continuously. The                            [id number]-[id number] ([id number]) scope was introduced through                            the anus and advanced to the the cecum, identified                            by appendiceal orifice and ileocecal valve. The                            colonoscopy was performed without difficulty. The                            patient tolerated the procedure well. The quality                            of the bowel preparation was excellent. The                            ileocecal valve, appendiceal orifice, and rectum                            were photographed. Scope In: 11:25:54 AM Scope Out: 11:47:05 AM Scope Withdrawal Time: 0 hours 11 minutes 23 seconds  Total Procedure Duration: 0 hours 21 minutes 11 seconds  Findings:      The perianal and digital rectal examinations were normal.      A few diverticula were found in the sigmoid colon.      Anal papilla(e) were hypertrophied.      External hemorrhoids were found during retroflexion. The hemorrhoids       were small. Impression:               - Diverticulosis in the sigmoid colon.                           -  Small external hemorrhoids                           - Anal papilla(e) were hypertrophied.                           - No specimens collected. Moderate Sedation:      Moderate (conscious) sedation was administered by the endoscopy nurse       and supervised by the endoscopist. The following parameters were       monitored: oxygen saturation, heart rate, blood pressure, CO2       capnography and response to care. Total physician intraservice time was       32 minutes. Recommendation:           - Patient has a contact number available for                            emergencies. The signs and symptoms of potential                            delayed complications were discussed with the                             patient. Return to normal activities tomorrow.                            Written discharge instructions were provided to the                            patient.                           - High fiber diet today.                           - Continue present medications.                           - Use sugar-free Metamucil one tablespoon PO daily.                           - Repeat colonoscopy in 5 years for screening                            purposes. Procedure Code(s):        --- Professional ---                           [phone], Colonoscopy, flexible; diagnostic, including                            collection of specimen(s) by brushing or washing,                            when performed (separate procedure)  74827, Moderate sedation; each additional 15                            minutes intraservice time                           G0500, Moderate sedation services provided by the                            same physician or other qualified health care                            professional performing a gastrointestinal                            endoscopic service that sedation supports,                            requiring the presence of an independent trained                            observer to assist in the monitoring of the                            patient's level of consciousness and physiological                            status; initial 15 minutes of intra-service time;                            patient age 56 years or older (additional time may                            be reported with [phone], as appropriate) Diagnosis Code(s):        --- Professional ---                           Z80.0, Family history of malignant neoplasm of                            digestive organs                           K62.89, Other specified diseases of anus and rectum                           K57.30, Diverticulosis of large intestine  without                            perforation or abscess without bleeding CPT copyright 2019 [employer]. All rights reserved. The codes documented in this report are preliminary and upon coder review may  be revised to meet current compliance requirements. Hildred Laser, MD Hildred Laser, MD 07/26/2020 12:04:16 PM This report has been signed electronically. Number of Addenda: 0

## 2020-07-26 NOTE — Discharge Instructions (Signed)
Resume usual medications as before Analpram-HC cream to be applied to the anal canal twice daily for 1 week and thereafter on as-needed basis.   High-fiber diet. Metamucil or equivalent 4 g by mouth daily at bedtime. No driving for 24 hours. Next colonoscopy would be in 5 years.       Colonoscopy, Adult, Care After This sheet gives you information about how to care for yourself after your procedure. Your doctor may also give you more specific instructions. If you have problems or questions, call your doctor. What can I expect after the procedure? After the procedure, it is common to have:  A small amount of blood in your poop (stool) for 24 hours.  Some gas.  Mild cramping or bloating in your belly (abdomen). Follow these instructions at home: Eating and drinking   Drink enough fluid to keep your pee (urine) pale yellow.  Follow instructions from your doctor about what you cannot eat or drink.  Return to your normal diet as told by your doctor. Avoid heavy or fried foods that are hard to digest. Activity  Rest as told by your doctor.  Do not sit for a long time without moving. Get up to take short walks every 1-2 hours. This is important. Ask for help if you feel weak or unsteady.  Return to your normal activities as told by your doctor. Ask your doctor what activities are safe for you. To help cramping and bloating:   Try walking around.  Put heat on your belly as told by your doctor. Use the heat source that your doctor recommends, such as a moist heat pack or a heating pad. ? Put a towel between your skin and the heat source. ? Leave the heat on for 20-30 minutes. ? Remove the heat if your skin turns bright red. This is very important if you are unable to feel pain, heat, or cold. You may have a greater risk of getting burned. General instructions  For the first 24 hours after the procedure: ? Do not drive or use machinery. ? Do not sign important  documents. ? Do not drink alcohol. ? Do your daily activities more slowly than normal. ? Eat foods that are soft and easy to digest.  Take over-the-counter or prescription medicines only as told by your doctor.  Keep all follow-up visits as told by your doctor. This is important. Contact a doctor if:  You have blood in your poop 2-3 days after the procedure. Get help right away if:  You have more than a small amount of blood in your poop.  You see large clumps of tissue (blood clots) in your poop.  Your belly is swollen.  You feel like you may vomit (nauseous).  You vomit.  You have a fever.  You have belly pain that gets worse, and medicine does not help your pain. Summary  After the procedure, it is common to have a small amount of blood in your poop. You may also have mild cramping and bloating in your belly.  For the first 24 hours after the procedure, do not drive or use machinery, do not sign important documents, and do not drink alcohol.  Get help right away if you have a lot of blood in your poop, feel like you may vomit, have a fever, or have more belly pain. This information is not intended to replace advice given to you by your health care provider. Make sure you discuss any questions you have with your  health care provider. Document Revised: 04/18/2019 Document Reviewed: 04/18/2019 Elsevier Patient Education  [place].     High-Fiber Diet Fiber, also called dietary fiber, is a type of carbohydrate that is found in fruits, vegetables, whole grains, and beans. A high-fiber diet can have many health benefits. Your health care provider may recommend a high-fiber diet to help:  Prevent constipation. Fiber can make your bowel movements more regular.  Lower your cholesterol.  Relieve the following conditions: ? Swelling of veins in the anus (hemorrhoids). ? Swelling and irritation (inflammation) of specific areas of the digestive tract (uncomplicated  diverticulosis). ? A problem of the large intestine (colon) that sometimes causes pain and diarrhea (irritable bowel syndrome, IBS).  Prevent overeating as part of a weight-loss plan.  Prevent heart disease, type 2 diabetes, and certain cancers. What is my plan? The recommended daily fiber intake in grams (g) includes:  38 g for men age 47 or younger.  30 g for men over age 41.  41 g for women age 28 or younger.  21 g for women over age 9. You can get the recommended daily intake of dietary fiber by:  Eating a variety of fruits, vegetables, grains, and beans.  Taking a fiber supplement, if it is not possible to get enough fiber through your diet. What do I need to know about a high-fiber diet?  It is better to get fiber through food sources rather than from fiber supplements. There is not a lot of research about how effective supplements are.  Always check the fiber content on the nutrition facts label of any prepackaged food. Look for foods that contain 5 g of fiber or more per serving.  Talk with a diet and nutrition specialist (dietitian) if you have questions about specific foods that are recommended or not recommended for your medical condition, especially if those foods are not listed below.  Gradually increase how much fiber you consume. If you increase your intake of dietary fiber too quickly, you may have bloating, cramping, or gas.  Drink plenty of water. Water helps you to digest fiber. What are tips for following this plan?  Eat a wide variety of high-fiber foods.  Make sure that half of the grains that you eat each day are whole grains.  Eat breads and cereals that are made with whole-grain flour instead of refined flour or white flour.  Eat brown rice, bulgur wheat, or millet instead of white rice.  Start the day with a breakfast that is high in fiber, such as a cereal that contains 5 g of fiber or more per serving.  Use beans in place of meat in soups,  salads, and pasta dishes.  Eat high-fiber snacks, such as berries, raw vegetables, nuts, and popcorn.  Choose whole fruits and vegetables instead of processed forms like juice or sauce. What foods can I eat?  Fruits Berries. Pears. Apples. Oranges. Avocado. Prunes and raisins. Dried figs. Vegetables Sweet potatoes. Spinach. Kale. Artichokes. Cabbage. Broccoli. Cauliflower. Green peas. Carrots. Squash. Grains Whole-grain breads. Multigrain cereal. Oats and oatmeal. Brown rice. Barley. Bulgur wheat. [place]. Quinoa. Bran muffins. Popcorn. Rye wafer crackers. Meats and other proteins Navy, kidney, and pinto beans. Soybeans. Split peas. Lentils. Nuts and seeds. Dairy Fiber-fortified yogurt. Beverages Fiber-fortified soy milk. Fiber-fortified orange juice. Other foods Fiber bars. The items listed above may not be a complete list of recommended foods and beverages. Contact a dietitian for more options. What foods are not recommended? Fruits Fruit juice. Cooked, strained  fruit. Vegetables Fried potatoes. Canned vegetables. Well-cooked vegetables. Grains White bread. Pasta made with refined flour. White rice. Meats and other proteins Fatty cuts of meat. Fried chicken or fried fish. Dairy Milk. Yogurt. Cream cheese. Sour cream. Fats and oils Butters. Beverages Soft drinks. Other foods Cakes and pastries. The items listed above may not be a complete list of foods and beverages to avoid. Contact a dietitian for more information. Summary  Fiber is a type of carbohydrate. It is found in fruits, vegetables, whole grains, and beans.  There are many health benefits of eating a high-fiber diet, such as preventing constipation, lowering blood cholesterol, helping with weight loss, and reducing your risk of heart disease, diabetes, and certain cancers.  Gradually increase your intake of fiber. Increasing too fast can result in cramping, bloating, and gas. Drink plenty of water while you  increase your fiber.  The best sources of fiber include whole fruits and vegetables, whole grains, nuts, seeds, and beans. This information is not intended to replace advice given to you by your health care provider. Make sure you discuss any questions you have with your health care provider. Document Revised: 07/27/2017 Document Reviewed: 07/27/2017 Elsevier Patient Education  [place].     Diverticulosis  Diverticulosis is a condition that develops when small pouches (diverticula) form in the wall of the large intestine (colon). The colon is where water is absorbed and stool (feces) is formed. The pouches form when the inside layer of the colon pushes through weak spots in the outer layers of the colon. You may have a few pouches or many of them. The pouches usually do not cause problems unless they become inflamed or infected. When this happens, the condition is called diverticulitis. What are the causes? The cause of this condition is not known. What increases the risk? The following factors may make you more likely to develop this condition:  Being older than age 84. Your risk for this condition increases with age. Diverticulosis is rare among people younger than age 28. By age 73, many people have it.  Eating a low-fiber diet.  Having frequent constipation.  Being overweight.  Not getting enough exercise.  Smoking.  Taking over-the-counter pain medicines, like aspirin and ibuprofen.  Having a family history of diverticulosis. What are the signs or symptoms? In most people, there are no symptoms of this condition. If you do have symptoms, they may include:  Bloating.  Cramps in the abdomen.  Constipation or diarrhea.  Pain in the lower left side of the abdomen. How is this diagnosed? Because diverticulosis usually has no symptoms, it is most often diagnosed during an exam for other colon problems. The condition may be diagnosed by:  Using a flexible scope  to examine the colon (colonoscopy).  Taking an X-ray of the colon after dye has been put into the colon (barium enema).  Having a CT scan. How is this treated? You may not need treatment for this condition. Your health care provider may recommend treatment to prevent problems. You may need treatment if you have symptoms or if you previously had diverticulitis. Treatment may include:  Eating a high-fiber diet.  Taking a fiber supplement.  Taking a live bacteria supplement (probiotic).  Taking medicine to relax your colon. Follow these instructions at home: Medicines  Take over-the-counter and prescription medicines only as told by your health care provider.  If told by your health care provider, take a fiber supplement or probiotic. Constipation prevention Your condition may cause  constipation. To prevent or treat constipation, you may need to:  Drink enough fluid to keep your urine pale yellow.  Take over-the-counter or prescription medicines.  Eat foods that are high in fiber, such as beans, whole grains, and fresh fruits and vegetables.  Limit foods that are high in fat and processed sugars, such as fried or sweet foods.  General instructions  Try not to strain when you have a bowel movement.  Keep all follow-up visits as told by your health care provider. This is important. Contact a health care provider if you:  Have pain in your abdomen.  Have bloating.  Have cramps.  Have not had a bowel movement in 3 days. Get help right away if:  Your pain gets worse.  Your bloating becomes very bad.  You have a fever or chills, and your symptoms suddenly get worse.  You vomit.  You have bowel movements that are bloody or black.  You have bleeding from your rectum. Summary  Diverticulosis is a condition that develops when small pouches (diverticula) form in the wall of the large intestine (colon).  You may have a few pouches or many of them.  This condition is  most often diagnosed during an exam for other colon problems.  Treatment may include increasing the fiber in your diet, taking supplements, or taking medicines. This information is not intended to replace advice given to you by your health care provider. Make sure you discuss any questions you have with your health care provider. Document Revised: 04/21/2019 Document Reviewed: 04/21/2019 Elsevier Patient Education  [place].

## 2020-07-26 NOTE — H&P (Signed)
Leah Lucas is an 54 y.o. female.   Chief Complaint: Patient is here for colonoscopy. HPI: Patient is 55 year old African-American female who is here for high rescreening colonoscopy.  She had 2 sisters died of colon carcinoma.  They were diagnosed in their 64s and they both died at age 4.  No other members of her extended family has had colon carcinoma.  Her bowels are regular.  She denies abdominal pain.  She has history of hemorrhoids and has noted bleeding in the past but not recently. She does not take aspirin.  Past Medical History:  Diagnosis Date  . Hypercholesteremia   . Seasonal allergies     Past Surgical History:  Procedure Laterality Date  . VENTRAL HERNIA REPAIR N/A 12/28/2019   Procedure: HERNIA REPAIR VENTRAL ADULT WITH MESH;  Surgeon: Aviva Signs, MD;  Location: [facility];  Service: General;  Laterality: N/A;    Family History  Problem Relation Age of Onset  . Diabetes Mother   . Heart disease Mother   . Hypertension Mother   . Congestive Heart Failure Mother   . Early death Father   . Hypertension Sister   . Hypertension Brother   . Heart attack Brother   . Hypertension Maternal Grandmother   . Diabetes Maternal Grandmother   . Hypertension Maternal Grandfather   . Hypertension Sister   . Hypertension Brother   . Diabetes Brother   . Heart attack Sister   . Heart disease Sister   . Congestive Heart Failure Sister   . Diabetes Sister   . Hypertension Sister   . Colon cancer Sister    Social History:  reports that she has never smoked. She has never used smokeless tobacco. She reports current alcohol use. She reports that she does not use drugs.  Allergies: No Known Allergies  Medications Prior to Admission  Medication Sig Dispense Refill  . Ascorbic Acid (VITAMIN C PO) Take 1 tablet by mouth daily.    . benzocaine (AMERICAINE) 20 % rectal ointment Place rectally every 3 (three) hours as needed for pain. (Patient taking differently: Place 1  application rectally every 3 (three) hours as needed for pain (hemmorroids). ) 28.4 g 0  . Cholecalciferol (VITAMIN D3 PO) Take 1 tablet by mouth daily.    . Flaxseed, Linseed, (FLAXSEED OIL PO) Take 1 capsule by mouth daily.    Marland Kitchen loratadine (CLARITIN) 10 MG tablet Take 10 mg by mouth daily as needed for allergies.    . pravastatin (PRAVACHOL) 20 MG tablet Take 20 mg by mouth daily.     Marland Kitchen VITAMIN A PO Take 1 tablet by mouth daily.    Marland Kitchen doxycycline (VIBRAMYCIN) 100 MG capsule Take 1 capsule (100 mg total) by mouth 2 (two) times daily. (Patient not taking: Reported on 07/12/2020) 20 capsule 0    Results for orders placed or performed during the hospital encounter of 07/25/20 (from the past 48 hour(s))  SARS CORONAVIRUS 2 (TAT 6-24 HRS) Nasopharyngeal Nasopharyngeal Swab     Status: None   Collection Time: 07/25/20  8:30 AM   Specimen: Nasopharyngeal Swab  Result Value Ref Range   SARS Coronavirus 2 NEGATIVE NEGATIVE    Comment: (NOTE) SARS-CoV-2 target nucleic acids are NOT DETECTED.  The SARS-CoV-2 RNA is generally detectable in upper and lower respiratory specimens during the acute phase of infection. Negative results do not preclude SARS-CoV-2 infection, do not rule out co-infections with other pathogens, and should not be used as the sole basis for treatment or other  patient management decisions. Negative results must be combined with clinical observations, patient history, and epidemiological information. The expected result is Negative.  Fact Sheet for Patients: SugarRoll.be  Fact Sheet for Healthcare Providers: https://www.woods-mathews.com/  This test is not yet approved or cleared by the [place] FDA and  has been authorized for detection and/or diagnosis of SARS-CoV-2 by FDA under an Emergency Use Authorization (EUA). This EUA will remain  in effect (meaning this test can be used) for the duration of the COVID-19 declaration  under Se ction 564(b)(1) of the Act, 21 U.S.C. section 360bbb-3(b)(1), unless the authorization is terminated or revoked sooner.  Performed at [facility], [place] [address] [place], [place] [address]    No results found.  Review of Systems  Blood pressure (!) 186/88, pulse 64, temperature 97.8 F (36.6 C), temperature source Oral, resp. rate 14, height 5\' 5"  (1.651 m), SpO2 100 %. Physical Exam HENT:     Mouth/Throat:     Mouth: Mucous membranes are moist.     Pharynx: Oropharynx is clear.  Eyes:     Conjunctiva/sclera: Conjunctivae normal.  Cardiovascular:     Rate and Rhythm: Normal rate and regular rhythm.     Heart sounds: Normal heart sounds. No murmur heard.   Pulmonary:     Effort: Pulmonary effort is normal.     Breath sounds: Normal breath sounds.  Abdominal:     Comments: Abdomen is symmetrical.  There is small midline scar in the upper abdomen.  Abdomen is soft and nontender without organomegaly or masses  Musculoskeletal:        General: No swelling.     Cervical back: Neck supple.  Lymphadenopathy:     Cervical: No cervical adenopathy.  Skin:    General: Skin is warm and dry.  Neurological:     Mental Status: She is alert.      Assessment/Plan  High risk screening colonoscopy. Family history of CRC in 2 first-degree relatives  Hildred Laser, MD 07/26/2020, 11:14 AM

## 2020-07-27 ENCOUNTER — Encounter (INDEPENDENT_AMBULATORY_CARE_PROVIDER_SITE_OTHER): Payer: Self-pay | Admitting: *Deleted

## 2020-07-30 NOTE — Telephone Encounter (Signed)
Noted will hold off on sending any medications to pharmacy until she calls back

## 2020-07-31 ENCOUNTER — Telehealth (INDEPENDENT_AMBULATORY_CARE_PROVIDER_SITE_OTHER): Payer: Self-pay

## 2020-07-31 ENCOUNTER — Other Ambulatory Visit: Payer: 59

## 2020-07-31 ENCOUNTER — Ambulatory Visit (INDEPENDENT_AMBULATORY_CARE_PROVIDER_SITE_OTHER): Payer: 59

## 2020-07-31 ENCOUNTER — Encounter (HOSPITAL_COMMUNITY): Payer: Self-pay | Admitting: Internal Medicine

## 2020-07-31 DIAGNOSIS — N939 Abnormal uterine and vaginal bleeding, unspecified: Secondary | ICD-10-CM | POA: Diagnosis not present

## 2020-07-31 NOTE — Telephone Encounter (Signed)
She said she has not used Anusol but go ahead and send it in to see if insurance will pay

## 2020-07-31 NOTE — Progress Notes (Signed)
PELVIC US TA/TV: enlarged heterogeneous anteverted uterus with mult fibroids,largest fibroids (#1) posterior fundal subserosal fibroid 3.4 x 2.4 x 3.6 cm,(#2) anterior left intramural fibroid 2.9 x 2.2 x 2.3 cm,EEC 5.5 mm,normal left ovary,simple right ovarian cyst 2 x 1.5 x 1.8 cm,no free fluid,no pain during ultrasound,ovaries appear mobile

## 2020-07-31 NOTE — Telephone Encounter (Signed)
Dr Laural Golden called this in for her after colonoscopy - can you check if she has used Anusol (not anal-pram) before and if this has been covered? Thanks.

## 2020-07-31 NOTE — Telephone Encounter (Signed)
Anusol cream was called to the patient's pharmacy/[facility] in [place]. Patient will apply to the rectal area twice daily,dispense 30 grams 1 refill.

## 2020-07-31 NOTE — Telephone Encounter (Signed)
Leah Lucas is calling stating that the cream that was called in for her hydrocortisone-pramoxine [facility]) 2.5-1 % rectal cream is not covered by her insurance and she cant afford to pick it up from the pharmacy, please advise?

## 2020-08-03 ENCOUNTER — Other Ambulatory Visit: Payer: Self-pay

## 2020-08-03 ENCOUNTER — Ambulatory Visit (INDEPENDENT_AMBULATORY_CARE_PROVIDER_SITE_OTHER): Payer: 59 | Admitting: Adult Health

## 2020-08-03 ENCOUNTER — Encounter: Payer: Self-pay | Admitting: Adult Health

## 2020-08-03 VITALS — BP 132/86 | HR 67 | Ht 65.0 in | Wt 198.0 lb

## 2020-08-03 DIAGNOSIS — Z113 Encounter for screening for infections with a predominantly sexual mode of transmission: Secondary | ICD-10-CM

## 2020-08-03 DIAGNOSIS — N939 Abnormal uterine and vaginal bleeding, unspecified: Secondary | ICD-10-CM | POA: Diagnosis not present

## 2020-08-03 DIAGNOSIS — D219 Benign neoplasm of connective and other soft tissue, unspecified: Secondary | ICD-10-CM | POA: Diagnosis not present

## 2020-08-03 MED ORDER — MEGESTROL ACETATE 40 MG PO TABS: ORAL_TABLET | ORAL | 3 refills | Status: DC

## 2020-08-03 NOTE — Progress Notes (Signed)
  Subjective:     Patient ID: Leah Lucas, female   DOB: [birth year], 54 y.o.   MRN: [id number]  HPI Leah Lucas is a 54 year old black female,single, G1P1, perimenopausal with vaginal bleeding, in for follow up after [place]. PCP is Dr Nevada Crane.  Review of Systems Bleeding still, not heavy Reviewed past medical,surgical, social and family history. Reviewed medications and allergies.     Objective:   Physical Exam BP 132/86 (BP Location: Right Arm, Patient Position: Sitting, Cuff Size: Normal)   Pulse 67   Ht 5\' 5"  (1.651 m)   Wt 198 lb (89.8 kg)   BMI 32.95 kg/m  Talk only: US showed small fibroids and follicular cyst right ovary.    Assessment:     1. Vaginal bleeding Will try megace Meds ordered this encounter  Medications  . megestrol (MEGACE) 40 MG tablet    Sig: Take 2 daily    Dispense:  60 tablet    Refill:  3    Order Specific Question:   Supervising Provider    Answer:   Elonda Husky, LUTHER H [[id number]]    2. Screening examination for STD (sexually transmitted disease) Check HIV and RPR after November 19, has orders  3. Fibroids    Plan:    Use condoms if has sex Follow up in 3 months

## 2020-09-03 ENCOUNTER — Encounter (HOSPITAL_COMMUNITY): Payer: Self-pay

## 2020-09-03 ENCOUNTER — Ambulatory Visit (HOSPITAL_COMMUNITY)
Admission: EM | Admit: 2020-09-03 | Discharge: 2020-09-03 | Disposition: A | Discharge status: Home / Self Care | Payer: 59 | Attending: Internal Medicine | Admitting: Internal Medicine

## 2020-09-03 ENCOUNTER — Other Ambulatory Visit: Payer: Self-pay

## 2020-09-03 DIAGNOSIS — J302 Other seasonal allergic rhinitis: Secondary | ICD-10-CM | POA: Diagnosis not present

## 2020-09-03 DIAGNOSIS — Z20822 Contact with and (suspected) exposure to covid-19: Secondary | ICD-10-CM | POA: Diagnosis not present

## 2020-09-03 MED ORDER — FLUTICASONE PROPIONATE 50 MCG/ACT NA SUSP: 1.0000 | Freq: Every day | NASAL | 0 refills | Status: DC

## 2020-09-03 NOTE — ED Provider Notes (Signed)
[facility]    CSN: [id number] Arrival date & time: 09/03/20  1435      History   Chief Complaint Chief Complaint  Patient presents with  . Allergy    HPI Leah Lucas is a 54 y.o. female with a history of seasonal allergies comes to urgent care with nasal congestion, itchy ears of 4 days duration as well as a 1 day history of postnasal drainage.  Patient denies any fever or chills.  No loss of taste or smell.  No generalized body aches.  Patient was previously diagnosed with Covid and currently remains on vaccinated.  She is planning on getting vaccinated later this week.  No shortness of breath, chest tightness or chest pain.  Patient has a history of seasonal allergies and has been taking Allegra for his allergy symptoms.  HPI  History reviewed. No pertinent past medical history.  There are no problems to display for this patient.   Past Surgical History:  Procedure Laterality Date  . HERNIA REPAIR      OB History   No obstetric history on file.      Home Medications    Prior to Admission medications   Medication Sig Start Date End Date Taking? Authorizing Provider  fluticasone (FLONASE) 50 MCG/ACT nasal spray Place 1 spray into both nostrils daily. 09/03/20   Laraina Sulton, Myrene Galas, MD    Family History Family History  Family history unknown: Yes    Social History Social History   Tobacco Use  . Smoking status: Never Smoker  Substance Use Topics  . Alcohol use: Not on file  . Drug use: Not on file     Allergies   Patient has no known allergies.   Review of Systems Review of Systems  Constitutional: Negative.  Negative for chills, fatigue and fever.  HENT: Positive for sore throat. Negative for sinus pressure, sinus pain and voice change.   Respiratory: Negative.  Negative for chest tightness, shortness of breath and wheezing.   Gastrointestinal: Negative.      Physical Exam Triage Vital Signs ED Triage Vitals  Enc Vitals Group      BP 09/03/20 1551 132/90     Pulse Rate 09/03/20 1551 71     Resp 09/03/20 1551 17     Temp 09/03/20 1551 98.3 F (36.8 C)     Temp Source 09/03/20 1551 Oral     SpO2 09/03/20 1551 100 %     Weight --      Height --      Head Circumference --      Peak Flow --      Pain Score 09/03/20 1548 0     Pain Loc --      Pain Edu? --      Excl. in [place]? --    No data found.  Updated Vital Signs BP 132/90 (BP Location: Right Arm)   Pulse 71   Temp 98.3 F (36.8 C) (Oral)   Resp 17   SpO2 100%   Visual Acuity Right Eye Distance:   Left Eye Distance:   Bilateral Distance:    Right Eye Near:   Left Eye Near:    Bilateral Near:     Physical Exam   UC Treatments / Results  Labs (all labs ordered are listed, but only abnormal results are displayed) Labs Reviewed  SARS CORONAVIRUS 2 (TAT 6-24 HRS)    EKG   Radiology No results found.  Procedures Procedures (including critical care time)  Medications Ordered in UC Medications - No data to display  Initial Impression / Assessment and Plan / UC Course  I have reviewed the triage vital signs and the nursing notes.  Pertinent labs & imaging results that were available during my care of the patient were reviewed by me and considered in my medical decision making (see chart for details).    1.  Seasonal allergic rhinitis: Fluticasone nasal spray Continue Allegra use COVID-19 PCR test has been sent Patient is advised to quarantine until COVID-19 test results are available Return to urgent care if symptoms worsen. Please sign up for MyChart to view your records. Final Clinical Impressions(s) / UC Diagnoses   Final diagnoses:  Seasonal allergic rhinitis, unspecified trigger     Discharge Instructions     Please use medications as directed If you are worsening symptoms please return to urgent care to be reevaluated Please quarantine until COVID-19 test results are available You can check your results from  [place].   ED Prescriptions    Medication Sig Dispense Auth. Provider   fluticasone (FLONASE) 50 MCG/ACT nasal spray Place 1 spray into both nostrils daily. 16 g Stela Iwasaki, Myrene Galas, MD     PDMP not reviewed this encounter.   Chase Picket, MD 09/03/20 [phone]

## 2020-09-03 NOTE — ED Triage Notes (Signed)
Pt presents with sinus drainage and sneezing X 4 days.

## 2020-09-03 NOTE — Discharge Instructions (Addendum)
Please use medications as directed If you are worsening symptoms please return to urgent care to be reevaluated Please quarantine until COVID-19 test results are available You can check your results from [place].

## 2020-09-04 ENCOUNTER — Encounter: Payer: Self-pay | Admitting: Adult Health

## 2020-09-04 LAB — SARS CORONAVIRUS 2 (TAT 6-24 HRS): SARS Coronavirus 2: NEGATIVE

## 2020-11-05 ENCOUNTER — Other Ambulatory Visit: Payer: Self-pay

## 2020-11-05 ENCOUNTER — Encounter: Payer: Self-pay | Admitting: Adult Health

## 2020-11-05 ENCOUNTER — Ambulatory Visit (INDEPENDENT_AMBULATORY_CARE_PROVIDER_SITE_OTHER): Payer: 59 | Admitting: Adult Health

## 2020-11-05 VITALS — BP 147/88 | HR 74 | Ht 65.0 in | Wt 202.0 lb

## 2020-11-05 DIAGNOSIS — D219 Benign neoplasm of connective and other soft tissue, unspecified: Secondary | ICD-10-CM

## 2020-11-05 DIAGNOSIS — N939 Abnormal uterine and vaginal bleeding, unspecified: Secondary | ICD-10-CM | POA: Diagnosis not present

## 2020-11-05 MED ORDER — NORETHINDRONE ACETATE 5 MG PO TABS: 5.0000 mg | ORAL_TABLET | Freq: Every day | ORAL | 3 refills | Status: DC

## 2020-11-05 NOTE — Progress Notes (Signed)
  Subjective:     Patient ID: Leah Lucas, female   DOB: [birth year], 55 y.o.   MRN: [id number]  HPI Leah Lucas is a 55 year old black female,single, G1P1 in complaining of bleeding even with megace. It did stop for a about a month. [place] was 17.5 06/14/20 and [place] 07/31/20 showed several small fibroids. PCP is Dr Nevada Crane.  Review of Systems Bleeding almost daily, even with megace Reviewed past medical,surgical, social and family history. Reviewed medications and allergies.     Objective:   Physical Exam BP (!) 147/88 (BP Location: Left Arm, Patient Position: Sitting, Cuff Size: Large)   Pulse 74   Ht 5\' 5"  (1.651 m)   Wt 202 lb (91.6 kg)   LMP 08/29/2020 (Approximate) Comment: bleeding since   BMI 33.61 kg/m  Skin warm and dry.Pelvic: external genitalia is normal in appearance no lesions, vagina:black blood without odor,urethra has no lesions or masses noted, cervix:smooth and bulbous, uterus: ULNS, non tender, no masses felt, adnexa: no masses or tenderness noted. Bladder is non tender and no masses felt.  Fall risk is low  Upstream - 11/05/20 1530      Pregnancy Intention Screening   Does the patient want to become pregnant in the next year? No    Does the patient's partner want to become pregnant in the next year? No    Would the patient like to discuss contraceptive options today? No      Contraception Wrap Up   Current Method No Method - Other Reason    End Method No Method - Other Reason    Contraception Counseling Provided No         Examination chaperoned by Levy Pupa LPN     Assessment:    1. Vaginal bleeding Stop megace and start aygestin IUD is option,if aygestin does not work, but she says "take it out" Meds ordered this encounter  Medications  . norethindrone (AYGESTIN) 5 MG tablet    Sig: Take 1 tablet (5 mg total) by mouth daily.    Dispense:  30 tablet    Refill:  3    Order Specific Question:   Supervising Provider    Answer:   Elonda Husky, LUTHER H [[id number]]     2. Fibroids    Plan:     Get labs for HIV and RPR done,orders in since October  Follow up in 8 weeks

## 2020-11-19 ENCOUNTER — Telehealth (INDEPENDENT_AMBULATORY_CARE_PROVIDER_SITE_OTHER): Payer: Self-pay | Admitting: Internal Medicine

## 2020-11-19 ENCOUNTER — Telehealth: Payer: Self-pay | Admitting: Adult Health

## 2020-11-19 NOTE — Telephone Encounter (Signed)
Patient left voice mail message stating she is still have hemorrhoid issues - please advise - ph# [phone]

## 2020-11-19 NOTE — Telephone Encounter (Signed)
If not improving with Anusol and Miralax daily, will need to refer her to general surgery for further treatment.   Ann, can you please refer the patient for gen surgery. Dx: hemorrhoids.  Thanks,  Maylon Peppers, MD Gastroenterology and Hepatology [facility]

## 2020-11-19 NOTE — Telephone Encounter (Signed)
Patient wants to discuss heavy bleeding with Derrek Monaco and stated that she can't go back to work until seen by [employer]. Patient scheduled an appt for 11/20/2020. Clinical staff will follow up with patient.

## 2020-11-19 NOTE — Telephone Encounter (Signed)
Pt's period started Friday; she is having heavy bleeding. Pt started birth control on 11/06/20. Pt was advised can have heavy bleeding when starting a new birth control; fibroids can also cause bleeding. Pt hasn't taken pill since Saturday. Pt was advised to keep appt for tomorrow to discuss. Pt voiced understanding. [place]

## 2020-11-19 NOTE — Telephone Encounter (Signed)
On July 31 2020 Dr.Rehman prescribed Anusol Cream for rectal bleeding. She called today  (left a voicemail) to say that she was still having issues with her hemorrhoids. Patient has also called her GYN office today to say that she had started her period on Friday and it was heavy bleeding, she started [facility] on 11/06/2020. Patient told then that she was having heavy bleeding. She was advised that to keep the appointment that she has tomorrow to discuss all this.  Please advise.

## 2020-11-20 ENCOUNTER — Other Ambulatory Visit: Payer: Self-pay

## 2020-11-20 ENCOUNTER — Encounter: Payer: Self-pay | Admitting: Adult Health

## 2020-11-20 ENCOUNTER — Ambulatory Visit (INDEPENDENT_AMBULATORY_CARE_PROVIDER_SITE_OTHER): Payer: 59 | Admitting: Adult Health

## 2020-11-20 VITALS — BP 142/91 | HR 74 | Ht 65.0 in | Wt 201.0 lb

## 2020-11-20 DIAGNOSIS — D219 Benign neoplasm of connective and other soft tissue, unspecified: Secondary | ICD-10-CM | POA: Diagnosis not present

## 2020-11-20 DIAGNOSIS — Z13220 Encounter for screening for lipoid disorders: Secondary | ICD-10-CM

## 2020-11-20 DIAGNOSIS — Z7689 Persons encountering health services in other specified circumstances: Secondary | ICD-10-CM | POA: Diagnosis not present

## 2020-11-20 DIAGNOSIS — N939 Abnormal uterine and vaginal bleeding, unspecified: Secondary | ICD-10-CM

## 2020-11-20 MED ORDER — MEDROXYPROGESTERONE ACETATE 150 MG/ML IM SUSY
150.0000 mg | PREFILLED_SYRINGE | Freq: Once | INTRAMUSCULAR | Status: AC
  Administered 2020-11-20: 150 mg via INTRAMUSCULAR

## 2020-11-20 MED ORDER — MEDROXYPROGESTERONE ACETATE 150 MG/ML IM SUSP: 150.0000 mg | INTRAMUSCULAR | 4 refills | Status: DC

## 2020-11-20 NOTE — Progress Notes (Signed)
  Subjective:     Patient ID: Leah Lucas, female   DOB: 1966/09/07, 55 y.o.   MRN: [id number]  HPI Leah Lucas is a 55 year old black female, single, G1P1 in complaining of increase of bleeding and  Cramping, wants it out. Has stopped aygestin it did not help, will consider depo.   Review of Systems Bleeding is heavier, aygestin did not work Increase in cramps Feels more sleepy,not really tired  Reviewed past medical,surgical, social and family history. Reviewed medications and allergies.     Objective:   Physical Exam BP (!) 142/91 (BP Location: Left Arm, Patient Position: Sitting, Cuff Size: Normal)   Pulse 74   Ht 5\' 5"  (1.651 m)   Wt 201 lb (91.2 kg)   LMP 11/16/2020   BMI 33.45 kg/m  Skin warm and dry. Neck: mid line trachea, normal thyroid, good ROM, no lymphadenopathy noted. Lungs: clear to ausculation bilaterally. Cardiovascular: regular rate and rhythm. Pelvic: external genitalia is normal in appearance no lesions, vagina: +blood,urethra has no lesions or masses noted, cervix:smooth and bulbous, uterus: ULNS, shape and contour, non tender, no masses felt, adnexa: no masses or tenderness noted. Bladder is non tender and no masses felt.  Co exam with Tinnie Gens NP student.   Fall risk is low  Upstream - 11/20/20 1154      Pregnancy Intention Screening   Does the patient want to become pregnant in the next year? No    Does the patient's partner want to become pregnant in the next year? No    Would the patient like to discuss contraceptive options today? No      Contraception Wrap Up   Current Method Abstinence    End Method Abstinence    Contraception Counseling Provided No          Assessment:     1. Vaginal bleeding Will give depo today in office Meds ordered this encounter  Medications  . medroxyPROGESTERone (DEPO-PROVERA) 150 MG/ML injection    Sig: Inject 1 mL (150 mg total) into the muscle every 3 (three) months.    Dispense:  1 mL    Refill:  4     Order Specific Question:   Supervising Provider    Answer:   Tania Ade H [[id number]]  Check CBC,CMP,TSH  2. Fibroids  3. Screening cholesterol level Check cholesterol 4. Menstrual regulation    Plan:     Return in  1 month to talk with Dr Elonda Husky about possible surgery  Note given missed work yesterday and today

## 2020-11-20 NOTE — Addendum Note (Signed)
Addended by: Dorita Sciara, Riyad Keena A on: 11/20/2020 01:25 PM   Modules accepted: Orders

## 2020-11-20 NOTE — Telephone Encounter (Signed)
Referral placed in Epic for [facility], they will contact patient

## 2020-11-21 ENCOUNTER — Other Ambulatory Visit (INDEPENDENT_AMBULATORY_CARE_PROVIDER_SITE_OTHER): Payer: Self-pay | Admitting: Internal Medicine

## 2020-11-21 LAB — LIPID PANEL
Chol/HDL Ratio: 4.8 ratio — ABNORMAL HIGH (ref 0.0–4.4)
Cholesterol, Total: 250 mg/dL — ABNORMAL HIGH (ref 100–199)
HDL: 52 mg/dL (ref >39)
LDL Chol Calc (NIH): 187 mg/dL — ABNORMAL HIGH (ref 0–99)
Triglycerides: 69 mg/dL (ref 0–149)
VLDL Cholesterol Cal: 11 mg/dL (ref 5–40)

## 2020-11-21 LAB — CBC
Hematocrit: 42.6 % (ref 34.0–46.6)
Hemoglobin: 14.4 g/dL (ref 11.1–15.9)
MCH: 27.9 pg (ref 26.6–33.0)
MCHC: 33.8 g/dL (ref 31.5–35.7)
MCV: 83 fL (ref 79–97)
Platelets: 443 10*3/uL (ref 150–450)
RBC: 5.16 x10E6/uL (ref 3.77–5.28)
RDW: 12.7 % (ref 11.7–15.4)
WBC: 6.4 10*3/uL (ref 3.4–10.8)

## 2020-11-21 LAB — COMPREHENSIVE METABOLIC PANEL
ALT: 15 IU/L (ref 0–32)
AST: 17 IU/L (ref 0–40)
Albumin/Globulin Ratio: 1.6 (ref 1.2–2.2)
Albumin: 4.5 g/dL (ref 3.8–4.9)
Alkaline Phosphatase: 71 IU/L (ref 44–121)
BUN/Creatinine Ratio: 7 — ABNORMAL LOW (ref 9–23)
BUN: 6 mg/dL (ref 6–24)
Bilirubin Total: 0.9 mg/dL (ref 0.0–1.2)
CO2: 22 mmol/L (ref 20–29)
Calcium: 10 mg/dL (ref 8.7–10.2)
Chloride: 100 mmol/L (ref 96–106)
Creatinine, Ser: 0.83 mg/dL (ref 0.57–1.00)
GFR calc Af Amer: 93 mL/min/{1.73_m2} (ref >59)
GFR calc non Af Amer: 81 mL/min/{1.73_m2} (ref >59)
Globulin, Total: 2.8 g/dL (ref 1.5–4.5)
Glucose: 85 mg/dL (ref 65–99)
Potassium: 4.7 mmol/L (ref 3.5–5.2)
Sodium: 139 mmol/L (ref 134–144)
Total Protein: 7.3 g/dL (ref 6.0–8.5)

## 2020-11-21 LAB — TSH: TSH: 1.49 u[IU]/mL (ref 0.450–4.500)

## 2020-11-21 MED ORDER — HYDROCORTISONE ACETATE 25 MG RE SUPP: 25.0000 mg | Freq: Every day | RECTAL | 0 refills | Status: DC

## 2020-11-21 NOTE — Progress Notes (Signed)
Patient called to let me know that she is still having problem with her hemorrhoids.  She had colonoscopy on 07/26/2020.  She was noted to have a small external hemorrhoids and anal tags.  She has been using Analpram-HC cream.  It it is helping some. We will treat her with Anusol HC suppository 1 per rectum daily at bedtime for 2 weeks.  She can use cream on the outside. She is supposed to call back with progress report in 2 weeks if she is not feeling any better we will plan to see in the office before considering banding or surgery

## 2020-11-22 NOTE — Telephone Encounter (Signed)
Noted - referral to surgeon has been made and that they will contact the patient.

## 2020-12-14 ENCOUNTER — Ambulatory Visit: Payer: 59 | Admitting: Obstetrics & Gynecology

## 2020-12-14 ENCOUNTER — Encounter: Payer: Self-pay | Admitting: Obstetrics & Gynecology

## 2020-12-14 ENCOUNTER — Other Ambulatory Visit: Payer: Self-pay

## 2020-12-14 VITALS — BP 135/82 | HR 79 | Ht 65.0 in | Wt 203.0 lb

## 2020-12-14 DIAGNOSIS — N938 Other specified abnormal uterine and vaginal bleeding: Secondary | ICD-10-CM

## 2020-12-14 DIAGNOSIS — D219 Benign neoplasm of connective and other soft tissue, unspecified: Secondary | ICD-10-CM

## 2020-12-14 NOTE — Progress Notes (Unsigned)
Chief Complaint  Patient presents with  . Discuss surgery      55 y.o. G1P1001 Patient's last menstrual period was 11/16/2020. The current method of family planning is Depo-Provera injections.  Outpatient Encounter Medications as of 12/14/2020  Medication Sig Note  . Ascorbic Acid (VITAMIN C PO) Take 1 tablet by mouth daily.   . Cholecalciferol (VITAMIN D3 PO) Take 1 tablet by mouth daily.   . Flaxseed, Linseed, (FLAXSEED OIL PO) Take 1 capsule by mouth daily.   Marland Kitchen ibuprofen (ADVIL) 200 MG tablet Take 400 mg by mouth as needed.   . loratadine (CLARITIN) 10 MG tablet Take 10 mg by mouth daily as needed for allergies.   . medroxyPROGESTERone (DEPO-PROVERA) 150 MG/ML injection Inject 1 mL (150 mg total) into the muscle every 3 (three) months.   Marland Kitchen VITAMIN A PO Take 1 tablet by mouth daily.   . fluticasone (FLONASE) 50 MCG/ACT nasal spray Place 1 spray into both nostrils daily. (Patient not taking: Reported on 12/14/2020) 11/05/2020: Needs refill  . hydrocortisone (ANUSOL-HC) 25 MG suppository Place 1 suppository (25 mg total) rectally at bedtime. (Patient not taking: Reported on 12/14/2020)   . hydrocortisone-pramoxine (ANALPRAM-HC) 2.5-1 % rectal cream Place 1 application rectally 2 (two) times daily. (Patient not taking: Reported on 12/14/2020)   . pravastatin (PRAVACHOL) 20 MG tablet Take 20 mg by mouth daily.  (Patient not taking: No sig reported)    No facility-administered encounter medications on file as of 12/14/2020.    Subjective *** Past Medical History:  Diagnosis Date  . Hypercholesteremia   . Seasonal allergies     Past Surgical History:  Procedure Laterality Date  . COLONOSCOPY N/A 07/26/2020   Procedure: COLONOSCOPY;  Surgeon: Rogene Houston, MD;  Location: [facility] SUITE;  Service: Endoscopy;  Laterality: N/A;  1030  . HERNIA REPAIR    . VENTRAL HERNIA REPAIR N/A 12/28/2019   Procedure: HERNIA REPAIR VENTRAL ADULT WITH MESH;  Surgeon: Aviva Signs, MD;   Location: [facility] ORS;  Service: General;  Laterality: N/A;    OB History    Gravida  1   Para  1   Term  1   Preterm  0   AB  0   Living  1     SAB  0   IAB  0   Ectopic  0   Multiple      Live Births  1           No Known Allergies  Social History   Socioeconomic History  . Marital status: Single    Spouse name: Not on file  . Number of children: Not on file  . Years of education: Not on file  . Highest education level: Not on file  Occupational History  . Not on file  Tobacco Use  . Smoking status: Never Smoker  . Smokeless tobacco: Never Used  Vaping Use  . Vaping Use: Never used  Substance and Sexual Activity  . Alcohol use: Yes    Comment: occ.  . Drug use: No  . Sexual activity: Not Currently    Birth control/protection: None    Comment: peri menopause   Other Topics Concern  . Not on file  Social History Narrative   ** Merged History Encounter **       Social Determinants of Health   Financial Resource Strain: Low Risk   . Difficulty of Paying Living Expenses: Not hard at all  Food Insecurity: No Food  Insecurity  . Worried About Charity fundraiser in the Last Year: Never true  . Ran Out of Food in the Last Year: Never true  Transportation Needs: No Transportation Needs  . Lack of Transportation (Medical): No  . Lack of Transportation (Non-Medical): No  Physical Activity: Insufficiently Active  . Days of Exercise per Week: 2 days  . Minutes of Exercise per Session: 30 min  Stress: No Stress Concern Present  . Feeling of Stress : Not at all  Social Connections: Moderately Integrated  . Frequency of Communication with Friends and Family: More than three times a week  . Frequency of Social Gatherings with Friends and Family: Three times a week  . Attends Religious Services: More than 4 times per year  . Active Member of Clubs or Organizations: Yes  . Attends Archivist Meetings: 1 to 4 times per year  . Marital Status: Never  married    Family History  Problem Relation Age of Onset  . Diabetes Mother   . Heart disease Mother   . Hypertension Mother   . Congestive Heart Failure Mother   . Early death Father   . Hypertension Sister   . Hypertension Brother   . Heart attack Brother   . Hypertension Maternal Grandmother   . Diabetes Maternal Grandmother   . Hypertension Maternal Grandfather   . Hypertension Sister   . Hypertension Brother   . Diabetes Brother   . Heart attack Sister   . Heart disease Sister   . Congestive Heart Failure Sister   . Diabetes Sister   . Hypertension Sister   . Colon cancer Sister     Medications:       Current Outpatient Medications:  .  Ascorbic Acid (VITAMIN C PO), Take 1 tablet by mouth daily., Disp: , Rfl:  .  Cholecalciferol (VITAMIN D3 PO), Take 1 tablet by mouth daily., Disp: , Rfl:  .  Flaxseed, Linseed, (FLAXSEED OIL PO), Take 1 capsule by mouth daily., Disp: , Rfl:  .  ibuprofen (ADVIL) 200 MG tablet, Take 400 mg by mouth as needed., Disp: , Rfl:  .  loratadine (CLARITIN) 10 MG tablet, Take 10 mg by mouth daily as needed for allergies., Disp: , Rfl:  .  medroxyPROGESTERone (DEPO-PROVERA) 150 MG/ML injection, Inject 1 mL (150 mg total) into the muscle every 3 (three) months., Disp: 1 mL, Rfl: 4 .  VITAMIN A PO, Take 1 tablet by mouth daily., Disp: , Rfl:  .  fluticasone (FLONASE) 50 MCG/ACT nasal spray, Place 1 spray into both nostrils daily. (Patient not taking: Reported on 12/14/2020), Disp: 16 g, Rfl: 0 .  hydrocortisone (ANUSOL-HC) 25 MG suppository, Place 1 suppository (25 mg total) rectally at bedtime. (Patient not taking: Reported on 12/14/2020), Disp: 14 suppository, Rfl: 0 .  hydrocortisone-pramoxine (ANALPRAM-HC) 2.5-1 % rectal cream, Place 1 application rectally 2 (two) times daily. (Patient not taking: Reported on 12/14/2020), Disp: 30 g, Rfl: 1 .  pravastatin (PRAVACHOL) 20 MG tablet, Take 20 mg by mouth daily.  (Patient not taking: No sig reported),  Disp: , Rfl:   Objective Blood pressure 135/82, pulse 79, height 5\' 5"  (1.651 m), weight 203 lb (92.1 kg), last menstrual period 11/16/2020.  ***  Pertinent ROS ***  Labs or studies ***    Impression Diagnoses this Encounter::   ICD-10-CM   1. DUB (dysfunctional uterine bleeding)  N93.8   2. Fibroids  D21.9     Established relevant diagnosis(es): ***  Plan/Recommendations: No orders  of the defined types were placed in this encounter.   Labs or Scans Ordered: No orders of the defined types were placed in this encounter.   Management:: ***  Follow up Return if symptoms worsen or fail to improve.     {50932: 10 min     99213: 15 min      99214:25 min}   Face to face time:  *** minutes  Greater than 50% of the visit time was spent in counseling and coordination of care with the patient.  The summary and outline of the counseling and care coordination is summarized in the note above.   All questions were answered.

## 2020-12-19 ENCOUNTER — Other Ambulatory Visit (INDEPENDENT_AMBULATORY_CARE_PROVIDER_SITE_OTHER): Payer: Self-pay | Admitting: Internal Medicine

## 2020-12-19 ENCOUNTER — Other Ambulatory Visit: Payer: Self-pay | Admitting: Family Medicine

## 2020-12-19 DIAGNOSIS — K649 Unspecified hemorrhoids: Secondary | ICD-10-CM

## 2020-12-20 ENCOUNTER — Other Ambulatory Visit (INDEPENDENT_AMBULATORY_CARE_PROVIDER_SITE_OTHER): Payer: Self-pay | Admitting: Internal Medicine

## 2020-12-20 MED ORDER — NYSTATIN-TRIAMCINOLONE 100000-0.1 UNIT/GM-% EX CREA: 1.0000 "application " | TOPICAL_CREAM | Freq: Two times a day (BID) | CUTANEOUS | 0 refills | Status: DC

## 2020-12-26 ENCOUNTER — Other Ambulatory Visit (HOSPITAL_COMMUNITY): Payer: Self-pay | Admitting: Adult Health

## 2020-12-26 DIAGNOSIS — Z1231 Encounter for screening mammogram for malignant neoplasm of breast: Secondary | ICD-10-CM

## 2020-12-28 ENCOUNTER — Ambulatory Visit (HOSPITAL_COMMUNITY): Payer: 59

## 2020-12-28 ENCOUNTER — Ambulatory Visit (HOSPITAL_COMMUNITY)
Admission: RE | Admit: 2020-12-28 | Discharge: 2020-12-28 | Disposition: A | Discharge status: Home / Self Care | Payer: 59 | Source: Ambulatory Visit | Attending: Adult Health | Admitting: Adult Health

## 2020-12-28 DIAGNOSIS — Z1231 Encounter for screening mammogram for malignant neoplasm of breast: Secondary | ICD-10-CM | POA: Insufficient documentation

## 2020-12-31 ENCOUNTER — Ambulatory Visit: Payer: 59 | Admitting: Adult Health

## 2021-01-01 ENCOUNTER — Ambulatory Visit: Payer: 59 | Admitting: General Surgery

## 2021-01-07 ENCOUNTER — Other Ambulatory Visit: Payer: Self-pay

## 2021-01-07 ENCOUNTER — Ambulatory Visit (INDEPENDENT_AMBULATORY_CARE_PROVIDER_SITE_OTHER): Payer: 59 | Admitting: Adult Health

## 2021-01-07 ENCOUNTER — Encounter: Payer: Self-pay | Admitting: Adult Health

## 2021-01-07 VITALS — BP 134/89 | HR 86 | Ht 65.0 in | Wt 202.0 lb

## 2021-01-07 DIAGNOSIS — N938 Other specified abnormal uterine and vaginal bleeding: Secondary | ICD-10-CM | POA: Insufficient documentation

## 2021-01-07 DIAGNOSIS — T7840XA Allergy, unspecified, initial encounter: Secondary | ICD-10-CM

## 2021-01-07 DIAGNOSIS — D219 Benign neoplasm of connective and other soft tissue, unspecified: Secondary | ICD-10-CM

## 2021-01-07 MED ORDER — LORATADINE-PSEUDOEPHEDRINE ER 10-240 MG PO TB24: 1.0000 | ORAL_TABLET | Freq: Every day | ORAL | Status: DC

## 2021-01-07 NOTE — Progress Notes (Signed)
  Subjective:     Patient ID: Leah Lucas, female   DOB: 04/22/1966, 55 y.o.   MRN: [id number]  HPI Leah Lucas is a 55 year old black female, single, G1P1 in complaining of sinus issues, eyes dry and itchy and has yellow nasal secretions with some blood.   Review of Systems Has dry itchy eyes Has yellow nasal secretions and has seen some blood, went to concert Saturday was smokey there Reviewed past medical,surgical, social and family history. Reviewed medications and allergies.     Objective:   Physical Exam BP 134/89 (BP Location: Left Arm, Patient Position: Sitting, Cuff Size: Normal)   Pulse 86   Ht 5\' 5"  (1.651 m)   Wt 202 lb (91.6 kg)   BMI 33.61 kg/m  Skin warm and dry. Lungs: clear to ausculation bilaterally. Cardiovascular: regular rate and rhythm.  Upstream - 01/07/21 1633      Pregnancy Intention Screening   Does the patient want to become pregnant in the next year? No    Does the patient's partner want to become pregnant in the next year? No    Would the patient like to discuss contraceptive options today? No      Contraception Wrap Up   Current Method Abstinence    End Method Abstinence    Contraception Counseling Provided No               Assessment:     1. DUB (dysfunctional uterine bleeding) Has surgery,hystrecsopy with D&C and ablation on 4/220/22 with Dr Elonda Husky   2. Fibroids  3. Allergy, initial encounter Try loratadine D Use humidifier     Plan:     Follow up prn

## 2021-01-08 ENCOUNTER — Ambulatory Visit: Payer: 59 | Admitting: Adult Health

## 2021-01-14 NOTE — Patient Instructions (Signed)
Leah Lucas  01/14/2021     @PREFPERIOPPHARMACY @   Your procedure is scheduled on  01/23/2021.   Report to [facility] at  0930  A.M.   Call this number if you have problems the morning of surgery:  [phone]   Remember:  Do not eat or drink after midnight.                         Take these medicines the morning of surgery with A SIP OF WATER  claritin   Place clean sheets on your bed the night before your procedure and DO NOT sleep with pets this night.  Shower with CHG the night before and the morning of your procedure. DO NOT put CHG on your face, hair or genitals.  After each shower, dry off with a clean towel, put on clean, comfortable clothes and brush your teeth.      Do not wear jewelry, make-up or nail polish.  Do not wear lotions, powders, or perfumes, or deodorant.  Do not shave 48 hours prior to surgery.  Men may shave face and neck.  Do not bring valuables to the hospital.  [facility] is not responsible for any belongings or valuables.   Contacts, dentures or bridgework may not be worn into surgery.  Leave your suitcase in the car.  After surgery it may be brought to your room.  For patients admitted to the hospital, discharge time will be determined by your treatment team.  Patients discharged the day of surgery will not be allowed to drive home and must have someone with them for 24 hours.     Special instructions:  DO NOT smoke tobacco or vape for 24 hours before your procedure.   Please read over the following fact sheets that you were given. Coughing and Deep Breathing, Surgical Site Infection Prevention, Anesthesia Post-op Instructions and Care and Recovery After Surgery       Dilation and Curettage or Vacuum Curettage, Care After This sheet gives you information about how to care for yourself after your procedure. Your doctor may also give you more specific instructions. If you have problems or questions, contact your  doctor. What can I expect after the procedure? After the procedure, it is common to have:  Mild pain or cramping.  Some bleeding or spotting from the vagina. These may last for up to 2 weeks. Follow these instructions at home: Medicines  Take over-the-counter and prescription medicines only as told by your doctor. This is very important if you take blood-thinning medicine.  Ask your doctor if the medicine prescribed to you requires you to avoid driving or using machinery. Activity  If you were given a medicine to help you relax (sedative) during your procedure, it can affect you for many hours. Do not drive or use machinery until your doctor says that it is safe.  Rest as told by your doctor.  Do not sit for a long time without moving. Get up to take short walks every 1-2 hours. This is important. Ask for help if you feel weak or unsteady.  Do not lift anything that is heavier than 10 lb (4.5 kg), or the limit that you are told, until your doctor says that it is safe.  Return to your normal activities as told by your doctor. Ask your doctor what activities are safe for you.   Lifestyle For at least 2 weeks, or as long  as told by your doctor:  Do not douche.  Do not use tampons.  Do not have sex. General instructions  Wear compression stockings as told by your doctor.  It is up to you to get the results of your procedure. Ask your doctor, or the department that is doing the procedure, when your results will be ready.  Keep all follow-up visits as told by your doctor. This is important. Contact a doctor if:  You have very bad cramps that get worse or do not get better with medicine.  You have very bad pain in your belly (abdomen).  You cannot drink fluids without vomiting.  You have pain in a different part of your pelvis. The pelvis is the area just above your thighs.  You have fluid from your vagina that smells bad.  You have a rash. Get help right away if:  You  are bleeding a lot from your vagina. A lot of bleeding means soaking more than one sanitary pad in 1 hour for 2 hours in a row.  You have a fever that is above 100.51F (38.0C).  Your belly feels very tender or hard.  You have chest pain.  You have trouble breathing.  You feel dizzy.  You feel light-headed.  You pass out (faint).  You have pain in your neck or shoulder area. These symptoms may be an emergency. Do not wait to see if the symptoms will go away. Get medical help right away. Call your local emergency services (911 in the [place] Do not drive yourself to the hospital. Summary  After your procedure, it is common to have pain or cramping. It is also common to have bleeding or spotting from your vagina.  Rest as told. Do not sit for a long time without moving. Get up to take short walks every 1-2 hours.  Do not lift anything that is heavier than 10 lb (4.5 kg), or the limit that you are told.  Contact your doctor if you have fluid from your vagina that smells bad.  Get help right away if you develop any problems from the procedure. Ask your doctor what problems to watch for. This information is not intended to replace advice given to you by your health care provider. Make sure you discuss any questions you have with your health care provider. Document Revised: 10/25/2019 Document Reviewed: 10/25/2019 Elsevier Patient Education  2021 [place] Anesthesia, Adult, Care After This sheet gives you information about how to care for yourself after your procedure. Your health care provider may also give you more specific instructions. If you have problems or questions, contact your health care provider. What can I expect after the procedure? After the procedure, the following side effects are common:  Pain or discomfort at the IV site.  Nausea.  Vomiting.  Sore throat.  Trouble concentrating.  Feeling cold or chills.  Feeling weak or tired.  Sleepiness  and fatigue.  Soreness and body aches. These side effects can affect parts of the body that were not involved in surgery. Follow these instructions at home: For the time period you were told by your health care provider:  Rest.  Do not participate in activities where you could fall or become injured.  Do not drive or use machinery.  Do not drink alcohol.  Do not take sleeping pills or medicines that cause drowsiness.  Do not make important decisions or sign legal documents.  Do not take care of children on your own.  Eating and drinking  Follow any instructions from your health care provider about eating or drinking restrictions.  When you feel hungry, start by eating small amounts of foods that are soft and easy to digest (bland), such as toast. Gradually return to your regular diet.  Drink enough fluid to keep your urine pale yellow.  If you vomit, rehydrate by drinking water, juice, or clear broth. General instructions  If you have sleep apnea, surgery and certain medicines can increase your risk for breathing problems. Follow instructions from your health care provider about wearing your sleep device: ? Anytime you are sleeping, including during daytime naps. ? While taking prescription pain medicines, sleeping medicines, or medicines that make you drowsy.  Have a responsible adult stay with you for the time you are told. It is important to have someone help care for you until you are awake and alert.  Return to your normal activities as told by your health care provider. Ask your health care provider what activities are safe for you.  Take over-the-counter and prescription medicines only as told by your health care provider.  If you smoke, do not smoke without supervision.  Keep all follow-up visits as told by your health care provider. This is important. Contact a health care provider if:  You have nausea or vomiting that does not get better with medicine.  You  cannot eat or drink without vomiting.  You have pain that does not get better with medicine.  You are unable to pass urine.  You develop a skin rash.  You have a fever.  You have redness around your IV site that gets worse. Get help right away if:  You have difficulty breathing.  You have chest pain.  You have blood in your urine or stool, or you vomit blood. Summary  After the procedure, it is common to have a sore throat or nausea. It is also common to feel tired.  Have a responsible adult stay with you for the time you are told. It is important to have someone help care for you until you are awake and alert.  When you feel hungry, start by eating small amounts of foods that are soft and easy to digest (bland), such as toast. Gradually return to your regular diet.  Drink enough fluid to keep your urine pale yellow.  Return to your normal activities as told by your health care provider. Ask your health care provider what activities are safe for you. This information is not intended to replace advice given to you by your health care provider. Make sure you discuss any questions you have with your health care provider. Document Revised: 06/07/2020 Document Reviewed: 01/05/2020 Elsevier Patient Education  2021 [employer].

## 2021-01-20 ENCOUNTER — Other Ambulatory Visit: Payer: Self-pay | Admitting: Obstetrics & Gynecology

## 2021-01-21 ENCOUNTER — Other Ambulatory Visit (HOSPITAL_COMMUNITY)
Admission: RE | Admit: 2021-01-21 | Discharge: 2021-01-21 | Disposition: A | Discharge status: Home / Self Care | Payer: 59 | Source: Ambulatory Visit | Attending: Obstetrics & Gynecology | Admitting: Obstetrics & Gynecology

## 2021-01-21 ENCOUNTER — Encounter (HOSPITAL_COMMUNITY): Payer: Self-pay

## 2021-01-21 ENCOUNTER — Other Ambulatory Visit: Payer: Self-pay

## 2021-01-21 ENCOUNTER — Encounter (HOSPITAL_COMMUNITY)
Admission: RE | Admit: 2021-01-21 | Discharge: 2021-01-21 | Disposition: A | Discharge status: Home / Self Care | Payer: 59 | Source: Ambulatory Visit | Attending: Obstetrics & Gynecology | Admitting: Obstetrics & Gynecology

## 2021-01-21 DIAGNOSIS — Z01812 Encounter for preprocedural laboratory examination: Secondary | ICD-10-CM | POA: Diagnosis present

## 2021-01-21 DIAGNOSIS — Z20822 Contact with and (suspected) exposure to covid-19: Secondary | ICD-10-CM | POA: Insufficient documentation

## 2021-01-21 LAB — COMPREHENSIVE METABOLIC PANEL
ALT: 13 U/L (ref 0–44)
AST: 13 U/L — ABNORMAL LOW (ref 15–41)
Albumin: 3.7 g/dL (ref 3.5–5.0)
Alkaline Phosphatase: 66 U/L (ref 38–126)
Anion gap: 9 (ref 5–15)
BUN: 7 mg/dL (ref 6–20)
CO2: 24 mmol/L (ref 22–32)
Calcium: 8.9 mg/dL (ref 8.9–10.3)
Chloride: 104 mmol/L (ref 98–111)
Creatinine, Ser: 0.63 mg/dL (ref 0.44–1.00)
GFR, Estimated: >60 mL/min (ref >60)
Glucose, Bld: 106 mg/dL — ABNORMAL HIGH (ref 70–99)
Potassium: 3.5 mmol/L (ref 3.5–5.1)
Sodium: 137 mmol/L (ref 135–145)
Total Bilirubin: 0.8 mg/dL (ref 0.3–1.2)
Total Protein: 7.4 g/dL (ref 6.5–8.1)

## 2021-01-21 LAB — URINALYSIS, ROUTINE W REFLEX MICROSCOPIC
Bilirubin Urine: NEGATIVE
Glucose, UA: NEGATIVE mg/dL
Hgb urine dipstick: NEGATIVE
Ketones, ur: NEGATIVE mg/dL
Leukocytes,Ua: NEGATIVE
Nitrite: NEGATIVE
Protein, ur: NEGATIVE mg/dL
Specific Gravity, Urine: 1.015 (ref 1.005–1.030)
pH: 7 (ref 5.0–8.0)

## 2021-01-21 LAB — CBC
HCT: 42.4 % (ref 36.0–46.0)
Hemoglobin: 13.5 g/dL (ref 12.0–15.0)
MCH: 27.4 pg (ref 26.0–34.0)
MCHC: 31.8 g/dL (ref 30.0–36.0)
MCV: 86.2 fL (ref 80.0–100.0)
Platelets: 397 10*3/uL (ref 150–400)
RBC: 4.92 MIL/uL (ref 3.87–5.11)
RDW: 13.2 % (ref 11.5–15.5)
WBC: 5.1 10*3/uL (ref 4.0–10.5)
nRBC: 0 % (ref 0.0–0.2)

## 2021-01-21 LAB — TYPE AND SCREEN
ABO/RH(D): O POS
Antibody Screen: NEGATIVE

## 2021-01-21 LAB — RAPID HIV SCREEN (HIV 1/2 AB+AG)
HIV 1/2 Antibodies: NONREACTIVE
HIV-1 P24 Antigen - HIV24: NONREACTIVE

## 2021-01-21 LAB — HCG, QUANTITATIVE, PREGNANCY: hCG, Beta Chain, Quant, S: <1 m[IU]/mL (ref <5)

## 2021-01-22 LAB — SARS CORONAVIRUS 2 (TAT 6-24 HRS): SARS Coronavirus 2: NEGATIVE

## 2021-01-23 ENCOUNTER — Encounter (HOSPITAL_COMMUNITY): Payer: Self-pay | Admitting: Obstetrics & Gynecology

## 2021-01-23 ENCOUNTER — Encounter (HOSPITAL_COMMUNITY)
Admission: RE | Disposition: A | Discharge status: Home / Self Care | Payer: Self-pay | Source: Home / Self Care | Attending: Obstetrics & Gynecology

## 2021-01-23 ENCOUNTER — Ambulatory Visit (HOSPITAL_COMMUNITY): Payer: 59 | Admitting: Anesthesiology

## 2021-01-23 ENCOUNTER — Other Ambulatory Visit: Payer: Self-pay

## 2021-01-23 ENCOUNTER — Ambulatory Visit (HOSPITAL_COMMUNITY)
Admission: RE | Admit: 2021-01-23 | Discharge: 2021-01-23 | Disposition: A | Discharge status: Home / Self Care | Payer: 59 | Attending: Obstetrics & Gynecology | Admitting: Obstetrics & Gynecology

## 2021-01-23 DIAGNOSIS — D251 Intramural leiomyoma of uterus: Secondary | ICD-10-CM | POA: Insufficient documentation

## 2021-01-23 DIAGNOSIS — N840 Polyp of corpus uteri: Secondary | ICD-10-CM | POA: Diagnosis not present

## 2021-01-23 DIAGNOSIS — D252 Subserosal leiomyoma of uterus: Secondary | ICD-10-CM | POA: Diagnosis not present

## 2021-01-23 DIAGNOSIS — N946 Dysmenorrhea, unspecified: Secondary | ICD-10-CM

## 2021-01-23 DIAGNOSIS — N921 Excessive and frequent menstruation with irregular cycle: Secondary | ICD-10-CM | POA: Diagnosis not present

## 2021-01-23 DIAGNOSIS — N938 Other specified abnormal uterine and vaginal bleeding: Secondary | ICD-10-CM | POA: Diagnosis not present

## 2021-01-23 DIAGNOSIS — N920 Excessive and frequent menstruation with regular cycle: Secondary | ICD-10-CM | POA: Diagnosis not present

## 2021-01-23 DIAGNOSIS — Z793 Long term (current) use of hormonal contraceptives: Secondary | ICD-10-CM | POA: Diagnosis not present

## 2021-01-23 HISTORY — PX: DILATATION AND CURETTAGE/HYSTEROSCOPY WITH MINERVA: SHX6851

## 2021-01-23 SURGERY — DILATATION AND CURETTAGE/HYSTEROSCOPY WITH MINERVA
Anesthesia: General

## 2021-01-23 MED ORDER — KETOROLAC TROMETHAMINE 30 MG/ML IJ SOLN
INTRAMUSCULAR | Status: AC
  Filled 2021-01-23: qty 1

## 2021-01-23 MED ORDER — CEFAZOLIN SODIUM-DEXTROSE 2-4 GM/100ML-% IV SOLN
2.0000 g | INTRAVENOUS | Status: AC
  Administered 2021-01-23: 2 g via INTRAVENOUS

## 2021-01-23 MED ORDER — MIDAZOLAM HCL 5 MG/5ML IJ SOLN
INTRAMUSCULAR | Status: DC | PRN
  Administered 2021-01-23: 2 mg via INTRAVENOUS

## 2021-01-23 MED ORDER — HYDROCODONE-ACETAMINOPHEN 5-325 MG PO TABS: 1.0000 | ORAL_TABLET | Freq: Four times a day (QID) | ORAL | 0 refills | Status: DC | PRN

## 2021-01-23 MED ORDER — FENTANYL CITRATE (PF) 100 MCG/2ML IJ SOLN: 25.0000 ug | INTRAMUSCULAR | Status: DC | PRN

## 2021-01-23 MED ORDER — ONDANSETRON 8 MG PO TBDP: 8.0000 mg | ORAL_TABLET | Freq: Three times a day (TID) | ORAL | 0 refills | Status: DC | PRN

## 2021-01-23 MED ORDER — PROPOFOL 10 MG/ML IV BOLUS
INTRAVENOUS | Status: DC | PRN
  Administered 2021-01-23: 200 mg via INTRAVENOUS

## 2021-01-23 MED ORDER — PROPOFOL 10 MG/ML IV BOLUS
INTRAVENOUS | Status: AC
  Filled 2021-01-23: qty 20

## 2021-01-23 MED ORDER — SODIUM CHLORIDE 0.9 % IR SOLN
Status: DC | PRN
  Administered 2021-01-23: 1000 mL

## 2021-01-23 MED ORDER — ONDANSETRON HCL 4 MG/2ML IJ SOLN: 4.0000 mg | Freq: Once | INTRAMUSCULAR | Status: DC | PRN

## 2021-01-23 MED ORDER — MIDAZOLAM HCL 2 MG/2ML IJ SOLN
INTRAMUSCULAR | Status: AC
  Filled 2021-01-23: qty 2

## 2021-01-23 MED ORDER — ONDANSETRON HCL 4 MG/2ML IJ SOLN
INTRAMUSCULAR | Status: AC
  Filled 2021-01-23: qty 2

## 2021-01-23 MED ORDER — FENTANYL CITRATE (PF) 100 MCG/2ML IJ SOLN
INTRAMUSCULAR | Status: DC | PRN
  Administered 2021-01-23: 25 ug via INTRAVENOUS
  Administered 2021-01-23: 50 ug via INTRAVENOUS

## 2021-01-23 MED ORDER — FENTANYL CITRATE (PF) 100 MCG/2ML IJ SOLN
INTRAMUSCULAR | Status: AC
  Filled 2021-01-23: qty 2

## 2021-01-23 MED ORDER — LIDOCAINE HCL (CARDIAC) PF 50 MG/5ML IV SOSY
PREFILLED_SYRINGE | INTRAVENOUS | Status: DC | PRN
  Administered 2021-01-23: 75 mg via INTRAVENOUS

## 2021-01-23 MED ORDER — KETOROLAC TROMETHAMINE 30 MG/ML IJ SOLN
30.0000 mg | Freq: Once | INTRAMUSCULAR | Status: AC
  Administered 2021-01-23: 30 mg via INTRAVENOUS

## 2021-01-23 MED ORDER — POVIDONE-IODINE 10 % EX SWAB: 2.0000 "application " | Freq: Once | CUTANEOUS | Status: DC

## 2021-01-23 MED ORDER — ONDANSETRON HCL 4 MG/2ML IJ SOLN
INTRAMUSCULAR | Status: DC | PRN
  Administered 2021-01-23: 4 mg via INTRAVENOUS

## 2021-01-23 MED ORDER — CEFAZOLIN SODIUM-DEXTROSE 2-4 GM/100ML-% IV SOLN
INTRAVENOUS | Status: AC
  Filled 2021-01-23: qty 100

## 2021-01-23 MED ORDER — LACTATED RINGERS IV SOLN: INTRAVENOUS | Status: DC | PRN

## 2021-01-23 MED ORDER — 0.9 % SODIUM CHLORIDE (POUR BTL) OPTIME
TOPICAL | Status: DC | PRN
  Administered 2021-01-23: 1000 mL

## 2021-01-23 MED ORDER — KETOROLAC TROMETHAMINE 10 MG PO TABS
10.0000 mg | ORAL_TABLET | Freq: Three times a day (TID) | ORAL | 0 refills | Status: DC | PRN
Start: 2021-01-23 — End: 2022-06-26

## 2021-01-23 MED ORDER — LIDOCAINE HCL (PF) 2 % IJ SOLN
INTRAMUSCULAR | Status: AC
  Filled 2021-01-23: qty 5

## 2021-01-23 SURGICAL SUPPLY — 26 items
BAG HAMPER (MISCELLANEOUS) ×2 IMPLANT
CLOTH BEACON ORANGE TIMEOUT ST (SAFETY) ×2 IMPLANT
COVER LIGHT HANDLE STERIS (MISCELLANEOUS) ×4 IMPLANT
COVER WAND RF STERILE (DRAPES) ×2 IMPLANT
GAUZE 4X4 16PLY RFD (DISPOSABLE) ×4 IMPLANT
GLOVE ECLIPSE 8.0 STRL XLNG CF (GLOVE) ×2 IMPLANT
GLOVE SRG 8 PF TXTR STRL LF DI (GLOVE) ×1 IMPLANT
GLOVE SURG UNDER POLY LF SZ7 (GLOVE) ×4 IMPLANT
GLOVE SURG UNDER POLY LF SZ8 (GLOVE) ×2
GOWN STRL REUS W/TWL LRG LVL3 (GOWN DISPOSABLE) ×2 IMPLANT
GOWN STRL REUS W/TWL XL LVL3 (GOWN DISPOSABLE) ×2 IMPLANT
HANDPIECE ABLA MINERVA ENDO (MISCELLANEOUS) ×2 IMPLANT
INST SET HYSTEROSCOPY (KITS) ×2 IMPLANT
IV NS 1000ML (IV SOLUTION) ×2
IV NS 1000ML BAXH (IV SOLUTION) ×1 IMPLANT
KIT TURNOVER CYSTO (KITS) ×2 IMPLANT
MANIFOLD NEPTUNE II (INSTRUMENTS) ×2 IMPLANT
MARKER SKIN DUAL TIP RULER LAB (MISCELLANEOUS) ×2 IMPLANT
NS IRRIG 1000ML POUR BTL (IV SOLUTION) ×2 IMPLANT
PACK BASIC III (CUSTOM PROCEDURE TRAY) ×2
PACK SRG BSC III STRL LF ECLPS (CUSTOM PROCEDURE TRAY) ×1 IMPLANT
PAD ARMBOARD 7.5X6 YLW CONV (MISCELLANEOUS) ×2 IMPLANT
PAD TELFA 3X4 1S STER (GAUZE/BANDAGES/DRESSINGS) ×2 IMPLANT
SET BASIN LINEN APH (SET/KITS/TRAYS/PACK) ×2 IMPLANT
SET CYSTO W/LG BORE CLAMP LF (SET/KITS/TRAYS/PACK) ×2 IMPLANT
SHEET LAVH (DRAPES) ×2 IMPLANT

## 2021-01-23 NOTE — Anesthesia Procedure Notes (Signed)
Procedure Name: LMA Insertion Date/Time: 01/23/2021 11:14 AM Performed by: Ollen Bowl, CRNA Pre-anesthesia Checklist: Patient identified, Patient being monitored, Emergency Drugs available, Timeout performed and Suction available Patient Re-evaluated:Patient Re-evaluated prior to induction Oxygen Delivery Method: Circle System Utilized Preoxygenation: Pre-oxygenation with 100% oxygen Induction Type: IV induction Ventilation: Mask ventilation without difficulty LMA: LMA inserted LMA Size: 4.0 Number of attempts: 1 Placement Confirmation: positive ETCO2 and breath sounds checked- equal and bilateral

## 2021-01-23 NOTE — Discharge Instructions (Signed)
Endometrial Ablation Endometrial ablation is a procedure that destroys the thin inner layer of the lining of the uterus (endometrium). This procedure may be done:  To stop heavy menstrual periods.  To stop bleeding that is causing anemia.  To control irregular bleeding.  To treat bleeding caused by small tumors (fibroids) in the endometrium. This procedure is often done as an alternative to major surgery, such as removal of the uterus and cervix (hysterectomy). As a result of this procedure:  You may not be able to have children. However, if you have not yet gone through menopause: ? You may still have a small chance of getting pregnant. ? You will need to use a reliable method of birth control after the procedure to prevent pregnancy.  You may stop having a menstrual period, or you may have only a small amount of bleeding during your period. Menstruation may return several years after the procedure. Tell a health care provider about:  Any allergies you have.  All medicines you are taking, including vitamins, herbs, eye drops, creams, and over-the-counter medicines.  Any problems you or family members have had with the use of anesthetic medicines.  Any blood disorders you have.  Any surgeries you have had.  Any medical conditions you have.  Whether you are pregnant or may be pregnant. What are the risks? Generally, this is a safe procedure. However, problems may occur, including:  A hole (perforation) in the uterus or bowel.  Infection in the uterus, bladder, or vagina.  Bleeding.  Allergic reaction to medicines.  Damage to nearby structures or organs.  An air bubble in the lung (air embolus).  Problems with pregnancy.  Failure of the procedure.  Decreased ability to diagnose cancer in the endometrium. Scar tissue forms after the procedure, making it more difficult to get a sample of the uterine lining. What happens before the procedure? Medicines Ask your health  care provider about:  Changing or stopping your regular medicines. This is especially important if you take diabetes medicines or blood thinners.  Taking medicines such as aspirin and ibuprofen. These medicines can thin your blood. Do not take these medicines before your procedure if your doctor tells you not to take them.  Taking over-the-counter medicines, vitamins, herbs, and supplements. Tests  You will have tests of your endometrium to make sure there are no precancerous cells or cancer cells present.  You may have an ultrasound of the uterus. General instructions  Do not use any products that contain nicotine or tobacco for at least 4 weeks before the procedure. These include cigarettes, chewing tobacco, and vaping devices, such as e-cigarettes. If you need help quitting, ask your health care provider.  You may be given medicines to thin the endometrium.  Ask your health care provider what steps will be taken to help prevent infection. These steps may include: ? Removing hair at the surgery site. ? Washing skin with a germ-killing soap. ? Taking antibiotic medicine.  Plan to have a responsible adult take you home from the hospital or clinic.  Plan to have a responsible adult care for you for the time you are told after you leave the hospital or clinic. This is important. What happens during the procedure?  You will lie on an exam table with your feet and legs supported as in a pelvic exam.  An IV will be inserted into one of your veins.  You will be given a medicine to help you relax (sedative).  A surgical tool with   a light and camera (resectoscope) will be inserted into your vagina and moved into your uterus. This allows your surgeon to see inside your uterus.  Endometrial tissue will be destroyed and removed, using one of the following methods: ? Radiofrequency. This uses an electrical current to destroy the endometrium. ? Cryotherapy. This uses extreme cold to freeze  the endometrium. ? Heated fluid. This uses a heated salt and water (saline) solution to destroy the endometrium. ? Microwave. This uses high-energy microwaves to heat up the endometrium and destroy it. ? Thermal balloon. This involves inserting a catheter with a balloon tip into the uterus. The balloon tip is filled with heated fluid to destroy the endometrium. The procedure may vary among health care providers and hospitals.   What happens after the procedure?  Your blood pressure, heart rate, breathing rate, and blood oxygen level will be monitored until you leave the hospital or clinic.  You may have vaginal bleeding for 4-6 weeks after the procedure. You may also have: ? Cramps. ? A thin, watery vaginal discharge that is light pink or brown. ? A need to urinate more than usual. ? Nausea.  If you were given a sedative during the procedure, it can affect you for several hours. Do not drive or operate machinery until your health care provider says that it is safe.  Do not have sex or insert anything into your vagina until your health care provider says it is safe. Summary  Endometrial ablation is done to treat many causes of heavy menstrual bleeding. The procedure destroys the thin inner layer of the lining of the uterus (endometrium).  This procedure is often done as an alternative to major surgery, such as removal of the uterus and cervix (hysterectomy).  Plan to have a responsible adult take you home from the hospital or clinic. This information is not intended to replace advice given to you by your health care provider. Make sure you discuss any questions you have with your health care provider. Document Revised: 04/12/2020 Document Reviewed: 04/12/2020 Elsevier Patient Education  2021 [employer]       

## 2021-01-23 NOTE — Op Note (Signed)
Preoperative diagnosis:  1.   Heavy menstrual bleeding                                         2.  dysfunctional uterine bleeding   Postoperative diagnoses: Same as above   Procedure: Hysteroscopy, uterine curettage, endometrial ablation using Minerva  Surgeon: Florian Buff   Anesthesia: Laryngeal mask airway  Findings: The endometrium was normal. There were no fibroid or other abnormalities.  Description of operation: The patient was taken to the operating room and placed in the supine position. She underwent general anesthesia using the laryngeal mask airway. She was placed in the dorsal lithotomy position and prepped and draped in the usual sterile fashion. A Graves speculum was placed and the anterior cervical lip was grasped with a single-tooth tenaculum. The cervix was dilated serially to allow passage of the hysteroscope. Diagnostic hysteroscopy was performed and was found to be normal. A vigorous uterine curettage was then performed and all tissue sent to pathology for evaluation.  I then proceeded to perform the Minerva endometrial ablation.   The uterus sounded to 9 cm The handpiece was attached to the Minerva power source/machine and the handpiece passed the checklist. The array was squeezed down to remove all of the air present.  The array was then place into the endometrial cavity and deployed to a length of 6.0 cm. The handpiece confirmed appropriate width by being in the green portion of the visual dial. The cervical cuff was then inflated to the point the CO2 indicator was in the green. The endometrial integrity check was then performed and integrity sequence was confirmed x 2. The heating was then begun and carried out for a total of 2 minutes(which is standard therapy time). When the plasma cycle was finished,  the cervical cuff was deflated and the array was removed with tissue present on the silicon membrane. There was appropriate post Minerva bleeding and uterine  discharge.     All of the equipment worked well throughout the procedure.  The patient was awakened from anesthesia and taken to the recovery room in good stable condition all counts were correct. She received 2 g of Ancef and 30 mg of Toradol preoperatively. She will be discharged from the recovery room and followed up in the office in 1- 2 weeks.   She can expect 4 weeks of post procedure bloody watery discharge  Florian Buff, MD  01/23/2021 11:48 AM

## 2021-01-23 NOTE — H&P (Signed)
Preoperative History and Physical  Leah Lucas is a 55 y.o. G1P1001 with No LMP recorded. Patient is premenopausal. admitted for a hysteroscopy uterine curettage Minerva endometrial ablation for heavy and dysfunctional uterine bleeding unresponsive to progestational agents.  Sonogram with small fibroids none are sub mucosal  PMH:    Past Medical History:  Diagnosis Date  . Hypercholesteremia   . Seasonal allergies     PSH:     Past Surgical History:  Procedure Laterality Date  . COLONOSCOPY N/A 07/26/2020   Procedure: COLONOSCOPY;  Surgeon: Rogene Houston, MD;  Location: [facility];  Service: Endoscopy;  Laterality: N/A;  1030  . HERNIA REPAIR    . VENTRAL HERNIA REPAIR N/A 12/28/2019   Procedure: HERNIA REPAIR VENTRAL ADULT WITH MESH;  Surgeon: Aviva Signs, MD;  Location: [facility];  Service: General;  Laterality: N/A;    POb/GynH:      OB History    Gravida  1   Para  1   Term  1   Preterm  0   AB  0   Living  1     SAB  0   IAB  0   Ectopic  0   Multiple      Live Births  1           SH:   Social History   Tobacco Use  . Smoking status: Never Smoker  . Smokeless tobacco: Never Used  Vaping Use  . Vaping Use: Never used  Substance Use Topics  . Alcohol use: Yes    Comment: occ.  . Drug use: No    FH:    Family History  Problem Relation Age of Onset  . Diabetes Mother   . Heart disease Mother   . Hypertension Mother   . Congestive Heart Failure Mother   . Early death Father   . Hypertension Sister   . Hypertension Brother   . Heart attack Brother   . Hypertension Maternal Grandmother   . Diabetes Maternal Grandmother   . Hypertension Maternal Grandfather   . Hypertension Sister   . Hypertension Brother   . Diabetes Brother   . Heart attack Sister   . Heart disease Sister   . Congestive Heart Failure Sister   . Diabetes Sister   . Hypertension Sister   . Colon cancer Sister      Allergies: No Known  Allergies  Medications:      No current facility-administered medications for this encounter.  Current Outpatient Medications:  .  Ascorbic Acid (VITAMIN C PO), Take 1 tablet by mouth daily., Disp: , Rfl:  .  Cholecalciferol (VITAMIN D3 PO), Take 1 tablet by mouth daily., Disp: , Rfl:  .  Flaxseed, Linseed, (FLAXSEED OIL PO), Take 1 capsule by mouth daily., Disp: , Rfl:  .  fluticasone (FLONASE) 50 MCG/ACT nasal spray, Place 1 spray into both nostrils daily. (Patient not taking: No sig reported), Disp: 16 g, Rfl: 0 .  hydrocortisone (ANUSOL-HC) 25 MG suppository, Place 1 suppository (25 mg total) rectally at bedtime. (Patient not taking: No sig reported), Disp: 14 suppository, Rfl: 0 .  ibuprofen (ADVIL) 200 MG tablet, Take 400 mg by mouth as needed., Disp: , Rfl:  .  loratadine (CLARITIN) 10 MG tablet, Take 10 mg by mouth daily as needed for allergies., Disp: , Rfl:  .  loratadine-pseudoephedrine (CLARITIN-D 24-HOUR) 10-240 MG 24 hr tablet, Take 1 tablet by mouth daily., Disp: , Rfl:  .  medroxyPROGESTERone (DEPO-PROVERA) 150  MG/ML injection, Inject 1 mL (150 mg total) into the muscle every 3 (three) months., Disp: 1 mL, Rfl: 4 .  nystatin-triamcinolone (MYCOLOG II) cream, Apply 1 application topically 2 (two) times daily. (Patient not taking: Reported on 01/07/2021), Disp: 30 g, Rfl: 0 .  pravastatin (PRAVACHOL) 20 MG tablet, Take 20 mg by mouth daily.  (Patient not taking: No sig reported), Disp: , Rfl:  .  VITAMIN A PO, Take 1 tablet by mouth daily., Disp: , Rfl:   Review of Systems:   Review of Systems  Constitutional: Negative for fever, chills, weight loss, malaise/fatigue and diaphoresis.  HENT: Negative for hearing loss, ear pain, nosebleeds, congestion, sore throat, neck pain, tinnitus and ear discharge.   Eyes: Negative for blurred vision, double vision, photophobia, pain, discharge and redness.  Respiratory: Negative for cough, hemoptysis, sputum production, shortness of breath,  wheezing and stridor.   Cardiovascular: Negative for chest pain, palpitations, orthopnea, claudication, leg swelling and PND.  Gastrointestinal: Positive for abdominal pain. Negative for heartburn, nausea, vomiting, diarrhea, constipation, blood in stool and melena.  Genitourinary: Negative for dysuria, urgency, frequency, hematuria and flank pain.  Musculoskeletal: Negative for myalgias, back pain, joint pain and falls.  Skin: Negative for itching and rash.  Neurological: Negative for dizziness, tingling, tremors, sensory change, speech change, focal weakness, seizures, loss of consciousness, weakness and headaches.  Endo/Heme/Allergies: Negative for environmental allergies and polydipsia. Does not bruise/bleed easily.  Psychiatric/Behavioral: Negative for depression, suicidal ideas, hallucinations, memory loss and substance abuse. The patient is not nervous/anxious and does not have insomnia.      PHYSICAL EXAM:  There were no vitals taken for this visit.    Vitals reviewed. Constitutional: She is oriented to person, place, and time. She appears well-developed and well-nourished.  HENT:  Head: Normocephalic and atraumatic.  Right Ear: External ear normal.  Left Ear: External ear normal.  Nose: Nose normal.  Mouth/Throat: Oropharynx is clear and moist.  Eyes: Conjunctivae and EOM are normal. Pupils are equal, round, and reactive to light. Right eye exhibits no discharge. Left eye exhibits no discharge. No scleral icterus.  Neck: Normal range of motion. Neck supple. No tracheal deviation present. No thyromegaly present.  Cardiovascular: Normal rate, regular rhythm, normal heart sounds and intact distal pulses.  Exam reveals no gallop and no friction rub.   No murmur heard. Respiratory: Effort normal and breath sounds normal. No respiratory distress. She has no wheezes. She has no rales. She exhibits no tenderness.  GI: Soft. Bowel sounds are normal. She exhibits no distension and no  mass. There is tenderness. There is no rebound and no guarding.  Genitourinary:       Vulva is normal without lesions Vagina is pink moist without discharge Cervix normal in appearance and pap is normal Uterus is normal size, contour, position, consistency, mobility, non-tender Adnexa is negative with normal sized ovaries by sonogram  Musculoskeletal: Normal range of motion. She exhibits no edema and no tenderness.  Neurological: She is alert and oriented to person, place, and time. She has normal reflexes. She displays normal reflexes. No cranial nerve deficit. She exhibits normal muscle tone. Coordination normal.  Skin: Skin is warm and dry. No rash noted. No erythema. No pallor.  Psychiatric: She has a normal mood and affect. Her behavior is normal. Judgment and thought content normal.    Labs: Results for orders placed or performed during the hospital encounter of 01/21/21 (from the past 336 hour(s))  SARS CORONAVIRUS 2 (TAT 6-24 HRS) Nasopharyngeal Nasopharyngeal  Swab   Collection Time: 01/21/21  9:57 AM   Specimen: Nasopharyngeal Swab  Result Value Ref Range   SARS Coronavirus 2 NEGATIVE NEGATIVE  Rapid HIV screen (HIV 1/2 Ab+Ag)   Collection Time: 01/21/21  9:58 AM  Result Value Ref Range   HIV-1 P24 Antigen - HIV24 NON REACTIVE NON REACTIVE   HIV 1/2 Antibodies NON REACTIVE NON REACTIVE   Interpretation (HIV Ag Ab)      A non reactive test result means that HIV 1 or HIV 2 antibodies and HIV 1 p24 antigen were not detected in the specimen.  CBC   Collection Time: 01/21/21  9:59 AM  Result Value Ref Range   WBC 5.1 4.0 - 10.5 K/uL   RBC 4.92 3.87 - 5.11 MIL/uL   Hemoglobin 13.5 12.0 - 15.0 g/dL   HCT 42.4 36.0 - 46.0 %   MCV 86.2 80.0 - 100.0 fL   MCH 27.4 26.0 - 34.0 pg   MCHC 31.8 30.0 - 36.0 g/dL   RDW 13.2 11.5 - 15.5 %   Platelets 397 150 - 400 K/uL   nRBC 0.0 0.0 - 0.2 %  Comprehensive metabolic panel   Collection Time: 01/21/21  9:59 AM  Result Value Ref Range    Sodium 137 135 - 145 mmol/L   Potassium 3.5 3.5 - 5.1 mmol/L   Chloride 104 98 - 111 mmol/L   CO2 24 22 - 32 mmol/L   Glucose, Bld 106 (H) 70 - 99 mg/dL   BUN 7 6 - 20 mg/dL   Creatinine, Ser 0.63 0.44 - 1.00 mg/dL   Calcium 8.9 8.9 - 10.3 mg/dL   Total Protein 7.4 6.5 - 8.1 g/dL   Albumin 3.7 3.5 - 5.0 g/dL   AST 13 (L) 15 - 41 U/L   ALT 13 0 - 44 U/L   Alkaline Phosphatase 66 38 - 126 U/L   Total Bilirubin 0.8 0.3 - 1.2 mg/dL   GFR, Estimated >60 >60 mL/min   Anion gap 9 5 - 15  hCG, quantitative, pregnancy   Collection Time: 01/21/21  9:59 AM  Result Value Ref Range   hCG, Beta Chain, Quant, S <1 <5 mIU/mL  Type and screen   Collection Time: 01/21/21  9:59 AM  Result Value Ref Range   ABO/RH(D) O POS    Antibody Screen NEG    Sample Expiration      01/24/2021,2359 Performed at [facility], [address], [place], [place] [address]   Urinalysis, Routine w reflex microscopic Urine, Clean Catch   Collection Time: 01/21/21 10:25 AM  Result Value Ref Range   Color, Urine YELLOW YELLOW   APPearance CLEAR CLEAR   Specific Gravity, Urine 1.015 1.005 - 1.030   pH 7.0 5.0 - 8.0   Glucose, UA NEGATIVE NEGATIVE mg/dL   Hgb urine dipstick NEGATIVE NEGATIVE   Bilirubin Urine NEGATIVE NEGATIVE   Ketones, ur NEGATIVE NEGATIVE mg/dL   Protein, ur NEGATIVE NEGATIVE mg/dL   Nitrite NEGATIVE NEGATIVE   Leukocytes,Ua NEGATIVE NEGATIVE    EKG: No orders found for this or any previous visit.  Imaging Studies:   GYNECOLOGIC SONOGRAM   Maitland Muhlbauer is a 55 y.o. G1P1 No LMP recorded. Patient is perimenopausal. She is here for a pelvic sonogram for irregular bleeding .  Uterus                      8.2 x 6 x 7.8 cm, Total uterine volume 200 cc,enlarged heterogeneous anteverted  uterus with mult fibroids,largest fibroids (#1) posterior fundal subserosal fibroid 3.4 x 2.4 x 3.6 cm,(#2) anterior left intramural fibroid 2.9 x 2.2 x 2.3 cm  Endometrium          5.5 mm,  symmetrical, wnl  Right ovary             2.8 x 2 x 2.7 cm, simple right ovarian cyst 2 x 1.5 x 1.8 cm  Left ovary                2.4 x 1 x 1.9 cm, wnl  No free fluid   Technician Comments:  PELVIC US TA/TV: enlarged heterogeneous anteverted uterus with mult fibroids,largest fibroids (#1) posterior fundal subserosal fibroid 3.4 x 2.4 x 3.6 cm,(#2) anterior left intramural fibroid 2.9 x 2.2 x 2.3 cm,EEC 5.5 mm,normal left ovary,simple right ovarian cyst 2 x 1.5 x 1.8 cm,no free fluid,no pain during ultrasound,ovaries appear mobile     [employer] 07/31/2020 5:23 PM  Clinical Impression and recommendations:  I have reviewed the sonogram results above, combined with the patient's current clinical course, below are my impressions and any appropriate recommendations for management based on the sonographic findings.  Uterus is slightly enlarged with relatively small fibroids noted, none submucosal, unlikely to be contributing to her irregular perimenopausal bleeding Endometrium is normal Ovaries normal size shape and morphology with physiologic follicular cyst noted on the right ovary   Florian Buff 08/01/2020 8:01 AM      MM 3D SCREEN BREAST BILATERAL  Result Date: 12/31/2020 CLINICAL DATA:  Screening. EXAM: DIGITAL SCREENING BILATERAL MAMMOGRAM WITH TOMOSYNTHESIS AND CAD TECHNIQUE: Bilateral screening digital craniocaudal and mediolateral oblique mammograms were obtained. Bilateral screening digital breast tomosynthesis was performed. The images were evaluated with computer-aided detection. COMPARISON:  Previous exam(s). ACR Breast Density Category b: There are scattered areas of fibroglandular density. FINDINGS: There are no findings suspicious for malignancy. The images were evaluated with computer-aided detection. IMPRESSION: No mammographic evidence of malignancy. A result letter of this screening mammogram will be mailed directly to the patient. RECOMMENDATION:  Screening mammogram in one year. (Code:SM-B-01Y) BI-RADS CATEGORY  1: Negative. Electronically Signed   By: Lillia Mountain M.D.   On: 12/31/2020 16:26      Assessment: Heavy menstrual bleeding/Dysfunctional uterine bleeding Unresponsive to megestrol and aygestin Small fibroids, none submucosal  Plan: hysteroscopy uterine curettage minerva endometrial ablation  Florian Buff 01/23/2021 8:18 AM

## 2021-01-23 NOTE — Transfer of Care (Signed)
Immediate Anesthesia Transfer of Care Note  Patient: Leah Lucas  Procedure(s) Performed: DILATATION AND CURETTAGE/HYSTEROSCOPY WITH MINERVA ENDOMETRIAL ABLATION (N/A )  Patient Location: PACU  Anesthesia Type:General  Level of Consciousness: awake  Airway & Oxygen Therapy: Patient Spontanous Breathing  Post-op Assessment: Report given to RN  Post vital signs: Reviewed  Last Vitals:  Vitals Value Taken Time  BP 158/91 01/23/21 1154  Temp    Pulse 84 01/23/21 1155  Resp 24 01/23/21 1155  SpO2 100 % 01/23/21 1155  Vitals shown include unvalidated device data.  Last Pain:  Vitals:   01/23/21 0930  TempSrc: Oral  PainSc: 0-No pain      Patients Stated Pain Goal: 9 (49/75/30 0511)  Complications: No complications documented.

## 2021-01-23 NOTE — Anesthesia Postprocedure Evaluation (Signed)
Anesthesia Post Note  Patient: Leah Lucas  Procedure(s) Performed: DILATATION AND CURETTAGE/HYSTEROSCOPY WITH MINERVA ENDOMETRIAL ABLATION (N/A )  Patient location during evaluation: PACU Anesthesia Type: General Level of consciousness: oriented and awake and alert Pain management: pain level controlled Vital Signs Assessment: post-procedure vital signs reviewed and stable Respiratory status: spontaneous breathing Cardiovascular status: blood pressure returned to baseline and stable Postop Assessment: no apparent nausea or vomiting Anesthetic complications: no   No complications documented.   Last Vitals:  Vitals:   01/23/21 1230 01/23/21 1245  BP: (!) 161/95 (!) 161/95  Pulse: 76 78  Resp: (!) 25 20  Temp:  36.6 C  SpO2: 100% 100%    Last Pain:  Vitals:   01/23/21 1245  TempSrc: Oral  PainSc: 0-No pain                 Jarek Longton

## 2021-01-23 NOTE — Interval H&P Note (Signed)
History and Physical Interval Note:  01/23/2021 11:02 AM  Leah Lucas  has presented today for surgery, with the diagnosis of * No pre-op diagnosis entered *.  The various methods of treatment have been discussed with the patient and family. After consideration of risks, benefits and other options for treatment, the patient has consented to  Procedure(s) with comments: DILATATION AND CURETTAGE/HYSTEROSCOPY WITH MINERVA ENDOMETRIAL ABLATION (N/A) - pt knows to arrive at 9:00 as a surgical intervention.  The patient's history has been reviewed, patient examined, no change in status, stable for surgery.  I have reviewed the patient's chart and labs.  Questions were answered to the patient's satisfaction.     Florian Buff

## 2021-01-23 NOTE — Anesthesia Preprocedure Evaluation (Signed)
Anesthesia Evaluation  Patient identified by MRN, date of birth, ID band Patient awake    Reviewed: Allergy & Precautions, H&P , NPO status , Patient's Chart, lab work & pertinent test results, reviewed documented beta blocker date and time   Airway Mallampati: II  TM Distance: >3 FB Neck ROM: full    Dental no notable dental hx.    Pulmonary neg pulmonary ROS,    Pulmonary exam normal breath sounds clear to auscultation       Cardiovascular Exercise Tolerance: Good negative cardio ROS   Rhythm:regular Rate:Normal     Neuro/Psych negative neurological ROS  negative psych ROS   GI/Hepatic negative GI ROS, Neg liver ROS,   Endo/Other  negative endocrine ROS  Renal/GU negative Renal ROS  negative genitourinary   Musculoskeletal   Abdominal   Peds  Hematology negative hematology ROS (+)   Anesthesia Other Findings   Reproductive/Obstetrics negative OB ROS                             Anesthesia Physical Anesthesia Plan  ASA: II  Anesthesia Plan: General   Post-op Pain Management:    Induction:   PONV Risk Score and Plan: Propofol infusion  Airway Management Planned:   Additional Equipment:   Intra-op Plan:   Post-operative Plan:   Informed Consent: I have reviewed the patients History and Physical, chart, labs and discussed the procedure including the risks, benefits and alternatives for the proposed anesthesia with the patient or authorized representative who has indicated his/her understanding and acceptance.     Dental Advisory Given  Plan Discussed with: CRNA  Anesthesia Plan Comments:         Anesthesia Quick Evaluation

## 2021-01-24 ENCOUNTER — Encounter (HOSPITAL_COMMUNITY): Payer: Self-pay | Admitting: Obstetrics & Gynecology

## 2021-01-24 LAB — SURGICAL PATHOLOGY

## 2021-01-31 ENCOUNTER — Encounter: Payer: 59 | Admitting: Obstetrics & Gynecology

## 2021-02-04 ENCOUNTER — Ambulatory Visit (INDEPENDENT_AMBULATORY_CARE_PROVIDER_SITE_OTHER): Payer: 59 | Admitting: Obstetrics & Gynecology

## 2021-02-04 ENCOUNTER — Encounter: Payer: Self-pay | Admitting: Obstetrics & Gynecology

## 2021-02-04 ENCOUNTER — Other Ambulatory Visit: Payer: Self-pay

## 2021-02-04 VITALS — BP 132/89 | HR 78 | Ht 65.0 in | Wt 208.0 lb

## 2021-02-04 DIAGNOSIS — Z9889 Other specified postprocedural states: Secondary | ICD-10-CM

## 2021-02-04 NOTE — Progress Notes (Signed)
  HPI: Patient returns for routine postoperative follow-up having undergone hysteroscopy uterine curettage on 01/23/21.  The patient's immediate postoperative recovery has been unremarkable. Since hospital discharge the patient reports no problems.   Current Outpatient Medications: Ascorbic Acid (VITAMIN C PO), Take 1 tablet by mouth daily., Disp: , Rfl:  Cholecalciferol (VITAMIN D3 PO), Take 1 tablet by mouth daily., Disp: , Rfl:  Flaxseed, Linseed, (FLAXSEED OIL PO), Take 1 capsule by mouth daily., Disp: , Rfl:  loratadine (CLARITIN) 10 MG tablet, Take 10 mg by mouth daily as needed for allergies., Disp: , Rfl:  pravastatin (PRAVACHOL) 20 MG tablet, Take 20 mg by mouth daily., Disp: , Rfl:  VITAMIN A PO, Take 1 tablet by mouth daily., Disp: , Rfl:  fluticasone (FLONASE) 50 MCG/ACT nasal spray, Place 1 spray into both nostrils daily. (Patient not taking: No sig reported), Disp: 16 g, Rfl: 0 HYDROcodone-acetaminophen (NORCO/VICODIN) 5-325 MG tablet, Take 1 tablet by mouth every 6 (six) hours as needed. (Patient not taking: Reported on 02/04/2021), Disp: 10 tablet, Rfl: 0 hydrocortisone (ANUSOL-HC) 25 MG suppository, Place 1 suppository (25 mg total) rectally at bedtime. (Patient not taking: No sig reported), Disp: 14 suppository, Rfl: 0 ibuprofen (ADVIL) 200 MG tablet, Take 400 mg by mouth as needed. (Patient not taking: Reported on 02/04/2021), Disp: , Rfl:  ketorolac (TORADOL) 10 MG tablet, Take 1 tablet (10 mg total) by mouth every 8 (eight) hours as needed. (Patient not taking: Reported on 02/04/2021), Disp: 15 tablet, Rfl: 0 loratadine-pseudoephedrine (CLARITIN-D 24-HOUR) 10-240 MG 24 hr tablet, Take 1 tablet by mouth daily. (Patient not taking: Reported on 02/04/2021), Disp: , Rfl:  nystatin-triamcinolone (MYCOLOG II) cream, Apply 1 application topically 2 (two) times daily. (Patient not taking: No sig reported), Disp: 30 g, Rfl: 0 ondansetron (ZOFRAN ODT) 8 MG disintegrating tablet, Take 1 tablet  (8 mg total) by mouth every 8 (eight) hours as needed for nausea or vomiting. (Patient not taking: Reported on 02/04/2021), Disp: 8 tablet, Rfl: 0  No current facility-administered medications for this visit.    Blood pressure 132/89, pulse 78, height 5\' 5"  (1.651 m), weight 208 lb (94.3 kg).  Physical Exam: Normal post ablation exam  Diagnostic Tests:   Pathology: Benign polyp  Impression:    ICD-10-CM   1. S/P endometrial ablation  Z98.890      Plan: No sex til discharge resolved    Follow up: No follow-ups on file.   Florian Buff, MD

## 2021-02-13 ENCOUNTER — Telehealth: Payer: Self-pay | Admitting: Obstetrics & Gynecology

## 2021-02-13 NOTE — Telephone Encounter (Signed)
Patient wants to speak with a nurse about abdominal pain. Clinical staff will follow up with patient.

## 2021-02-13 NOTE — Telephone Encounter (Signed)
Pt had abd pain yesterday. Still has pain today, not as bad. Felt like a bad cramp. Pt don't feel constipated. Pt had an ablation 3 weeks ago. Pt states she hasn't lifted anything heavy and hasn't been doing anything she's not supposed to. Could this be from the surgery? Has a brown discharge with a slight odor. Please advise. Thanks!! [place]

## 2021-02-13 NOTE — Telephone Encounter (Signed)
Don't usually have pain this far out but it could be and I would assume it is somehow related, maybe it's "period" related Cramps without bleeding or prior to some bleeding The discharge is pretty typical still

## 2021-02-13 NOTE — Telephone Encounter (Signed)
Pt aware of Dr. Brynda Greathouse recommendations and she voiced understanding. Pt was advised can try Tylenol or Ibuprofen for cramps. [place]

## 2021-07-11 ENCOUNTER — Encounter (HOSPITAL_COMMUNITY): Payer: Self-pay | Admitting: Emergency Medicine

## 2021-07-11 ENCOUNTER — Ambulatory Visit (HOSPITAL_COMMUNITY)
Admission: EM | Admit: 2021-07-11 | Discharge: 2021-07-11 | Disposition: A | Discharge status: Home / Self Care | Payer: 59 | Attending: Emergency Medicine | Admitting: Emergency Medicine

## 2021-07-11 ENCOUNTER — Other Ambulatory Visit: Payer: Self-pay

## 2021-07-11 DIAGNOSIS — Z23 Encounter for immunization: Secondary | ICD-10-CM

## 2021-07-11 DIAGNOSIS — S59911A Unspecified injury of right forearm, initial encounter: Secondary | ICD-10-CM

## 2021-07-11 DIAGNOSIS — W503XXA Accidental bite by another person, initial encounter: Secondary | ICD-10-CM

## 2021-07-11 MED ORDER — TETANUS-DIPHTH-ACELL PERTUSSIS 5-2.5-18.5 LF-MCG/0.5 IM SUSY
0.5000 mL | PREFILLED_SYRINGE | Freq: Once | INTRAMUSCULAR | Status: AC
  Administered 2021-07-11: 0.5 mL via INTRAMUSCULAR

## 2021-07-11 MED ORDER — TETANUS-DIPHTH-ACELL PERTUSSIS 5-2.5-18.5 LF-MCG/0.5 IM SUSY
PREFILLED_SYRINGE | INTRAMUSCULAR | Status: AC
  Filled 2021-07-11: qty 0.5

## 2021-07-11 NOTE — Discharge Instructions (Addendum)
Can use Diclofenac/Voltaren gel, can be used 4 times a day as needed for comfort  May use over-the-counter ibuprofen 800 mg 3 times a day (every 8 hours) as needed in addition to the Voltaren gel  It is good that the bite mark did not puncture the skin and this lowers the risk of infection however if you begin to have increased pain, redness, drainage, fever, chills please return for further evaluation
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
## 2021-11-05 ENCOUNTER — Other Ambulatory Visit (HOSPITAL_COMMUNITY): Payer: Self-pay | Admitting: Adult Health

## 2021-11-05 DIAGNOSIS — Z1231 Encounter for screening mammogram for malignant neoplasm of breast: Secondary | ICD-10-CM

## 2021-12-26 ENCOUNTER — Ambulatory Visit: Payer: 59 | Admitting: Podiatry

## 2021-12-30 ENCOUNTER — Other Ambulatory Visit: Payer: Self-pay

## 2021-12-30 ENCOUNTER — Ambulatory Visit (HOSPITAL_COMMUNITY)
Admission: RE | Admit: 2021-12-30 | Discharge: 2021-12-30 | Disposition: A | Discharge status: Home / Self Care | Payer: 59 | Source: Ambulatory Visit | Attending: Adult Health | Admitting: Adult Health

## 2021-12-30 DIAGNOSIS — Z1231 Encounter for screening mammogram for malignant neoplasm of breast: Secondary | ICD-10-CM | POA: Insufficient documentation

## 2022-05-13 IMAGING — MG MM DIGITAL SCREENING BILAT W/ TOMO AND CAD
6 of 10 series · 6 of 30 positions shown · non-contrast
Comparison: Previous exam(s).

CLINICAL DATA: Screening.

EXAM:
DIGITAL SCREENING BILATERAL MAMMOGRAM WITH TOMOSYNTHESIS AND CAD
TECHNIQUE: Bilateral screening digital craniocaudal and mediolateral oblique
mammograms were obtained. Bilateral screening digital breast
tomosynthesis was performed. The images were evaluated with
computer-aided detection.

[R CC synth-2D]
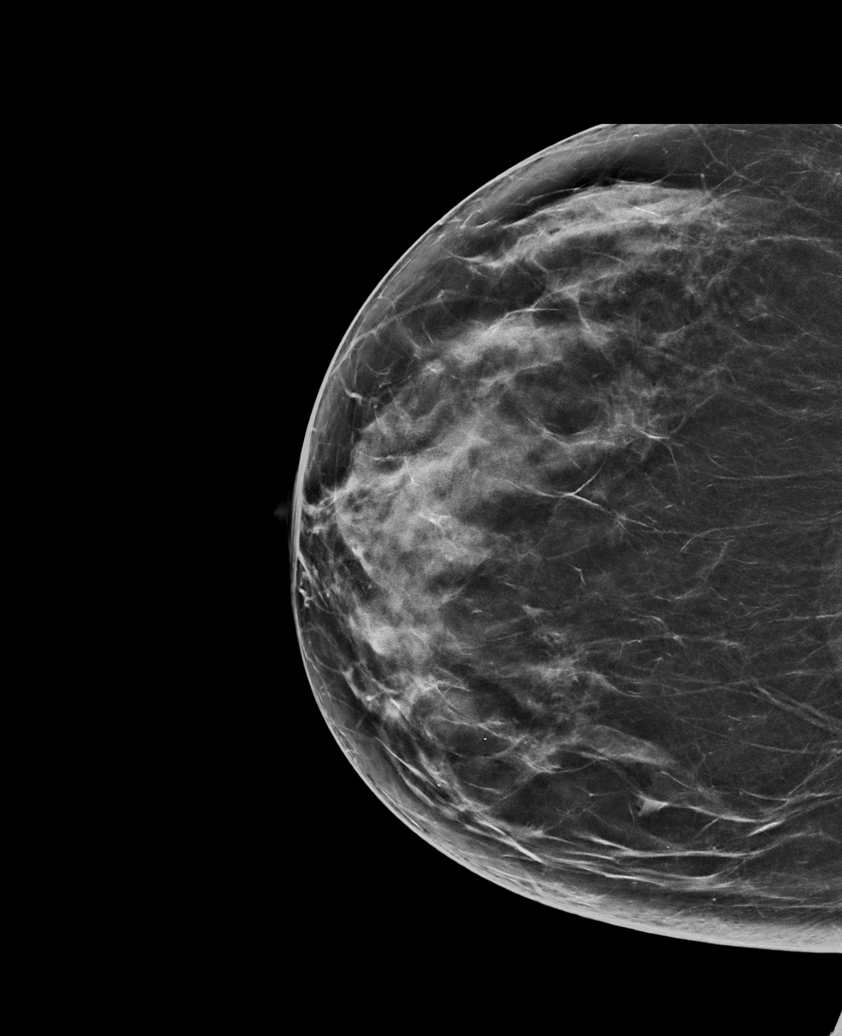

[R MLO synth-2D]
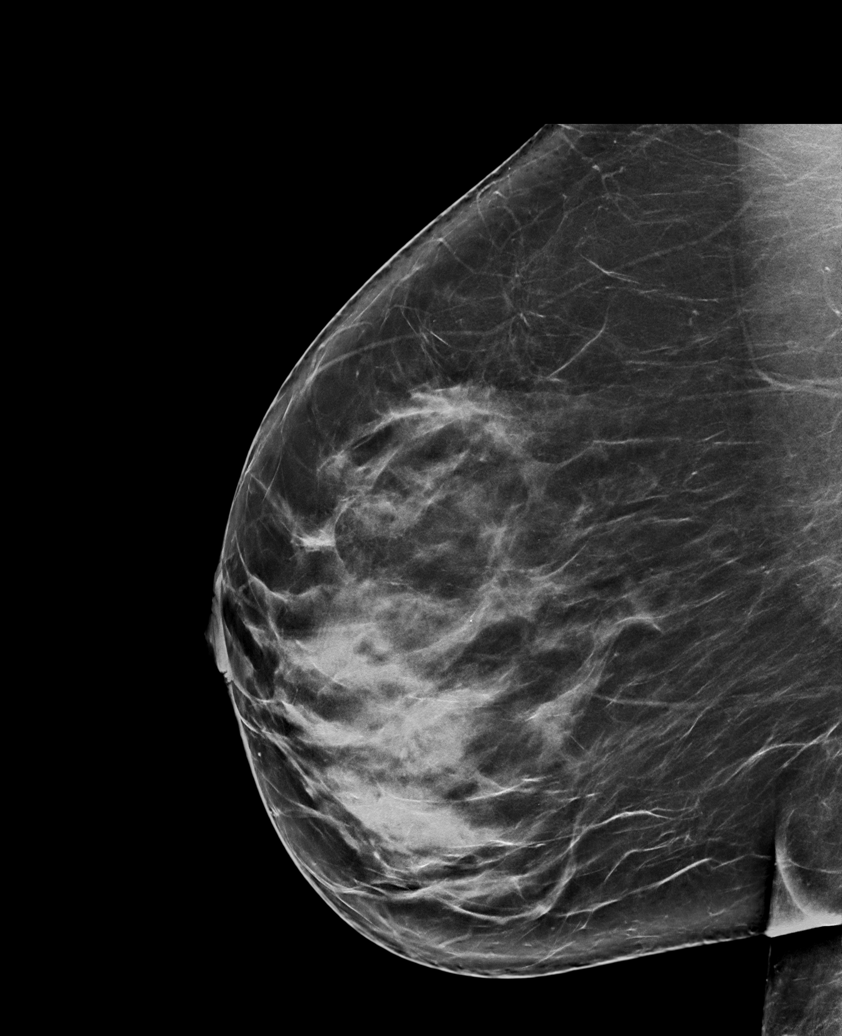

[L CC synth-2D (1 of 2)]
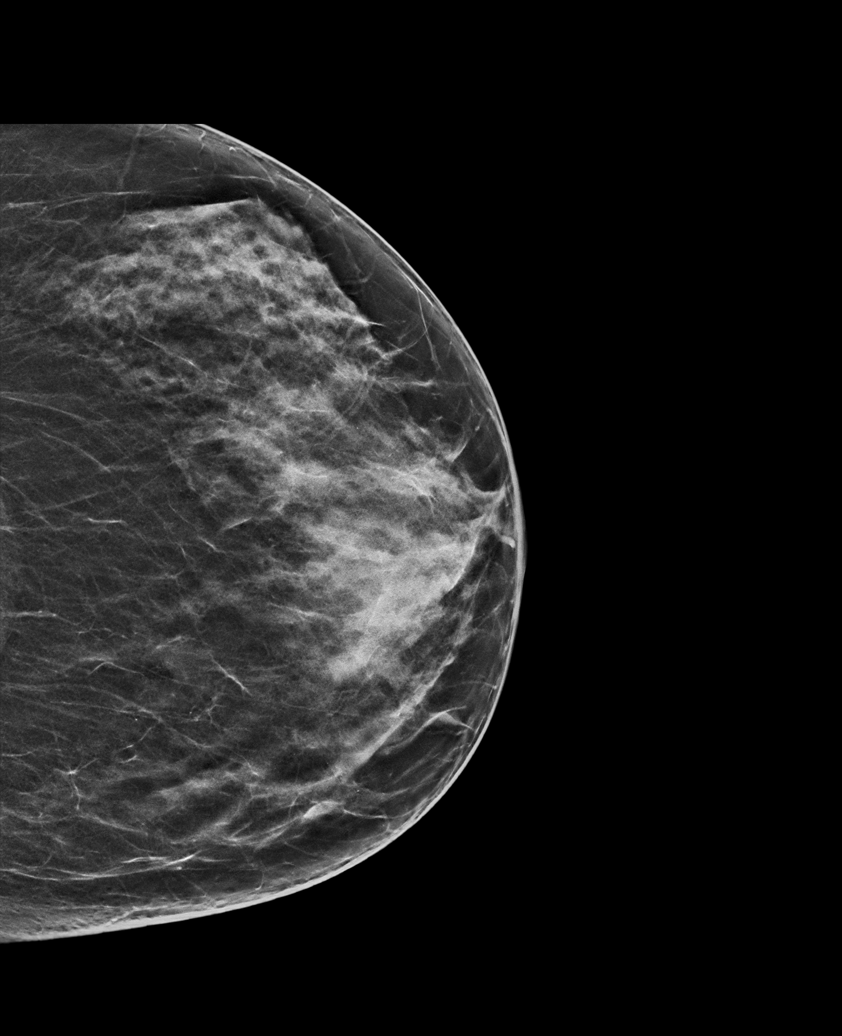

[L MLO synth-2D]
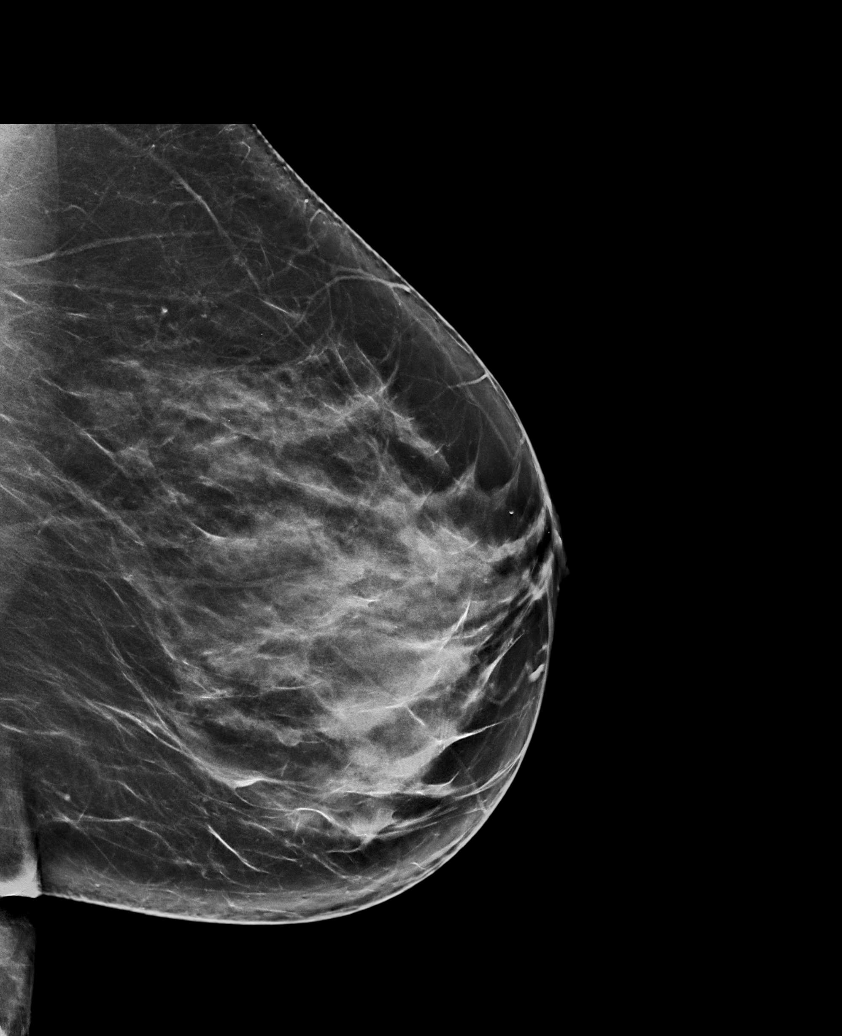

[L CC synth-2D (2 of 2)]
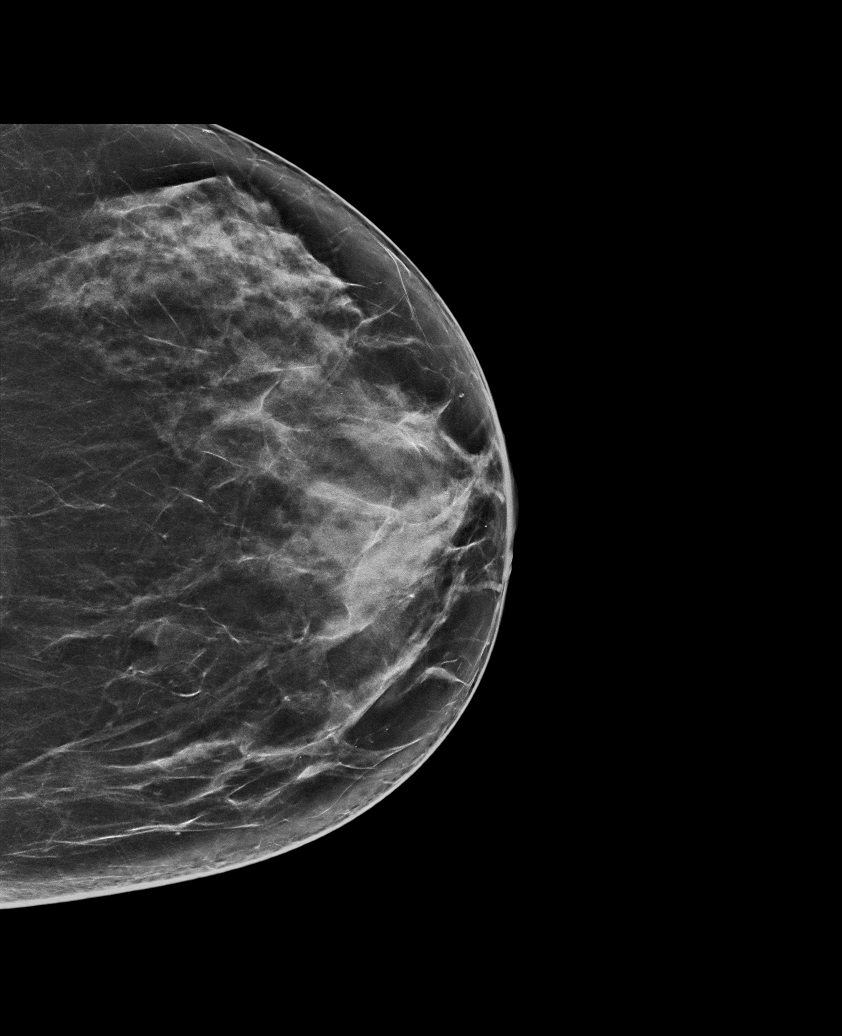

[L CC tomo · tomo slice 21/41.0]
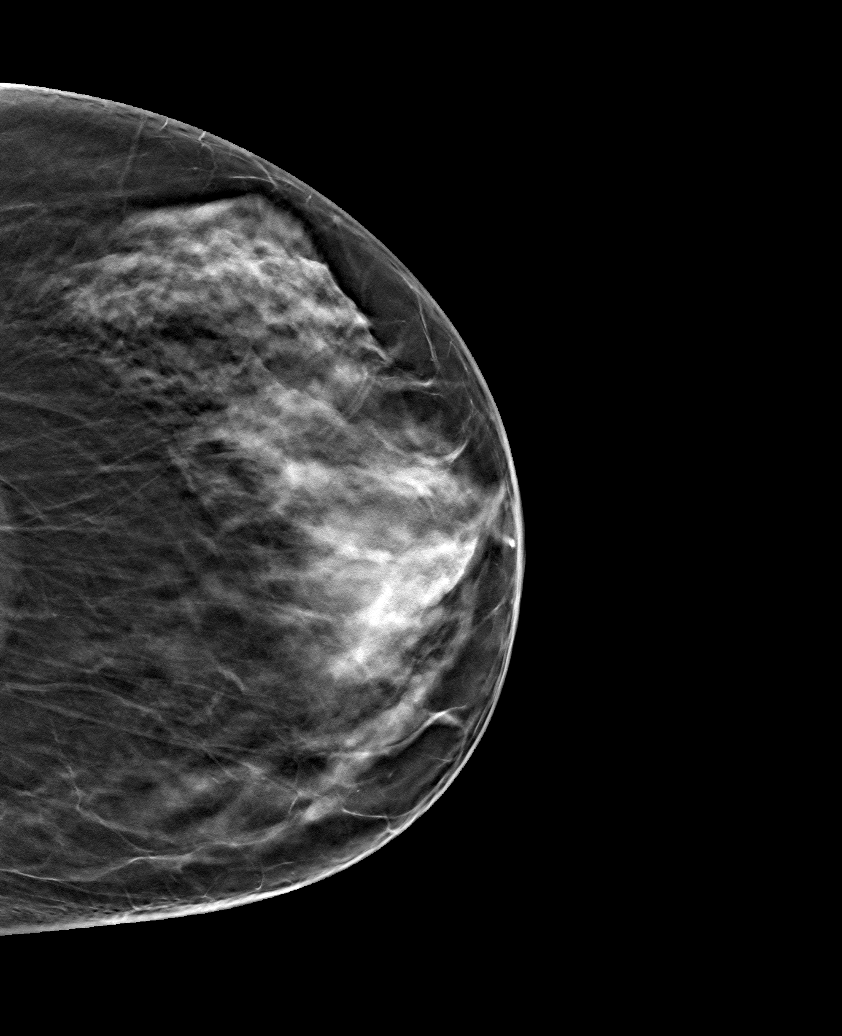

[6 of 30 positions shown; findings below may reference images not displayed]

ACR Breast Density Category c: The breast tissue is heterogeneously
dense, which may obscure small masses.
FINDINGS: There are no findings suspicious for malignancy.
IMPRESSION: No mammographic evidence of malignancy. A result letter of this
screening mammogram will be mailed directly to the patient.

RECOMMENDATION:
Screening mammogram in one year. (Code:[id number])

BI-RADS CATEGORY  1: Negative.

## 2022-06-26 ENCOUNTER — Ambulatory Visit (HOSPITAL_COMMUNITY)
Admission: EM | Admit: 2022-06-26 | Discharge: 2022-06-26 | Disposition: A | Discharge status: Home / Self Care | Payer: 59 | Attending: Emergency Medicine | Admitting: Emergency Medicine

## 2022-06-26 ENCOUNTER — Encounter (HOSPITAL_COMMUNITY): Payer: Self-pay

## 2022-06-26 ENCOUNTER — Telehealth (HOSPITAL_COMMUNITY): Payer: Self-pay | Admitting: *Deleted

## 2022-06-26 DIAGNOSIS — Z20822 Contact with and (suspected) exposure to covid-19: Secondary | ICD-10-CM | POA: Insufficient documentation

## 2022-06-26 DIAGNOSIS — J069 Acute upper respiratory infection, unspecified: Secondary | ICD-10-CM | POA: Diagnosis not present

## 2022-06-26 LAB — SARS CORONAVIRUS 2 BY RT PCR: SARS Coronavirus 2 by RT PCR: NEGATIVE

## 2022-06-26 MED ORDER — IPRATROPIUM BROMIDE 0.06 % NA SOLN: 2.0000 | Freq: Four times a day (QID) | NASAL | 0 refills | Status: DC

## 2022-06-26 MED ORDER — PROMETHAZINE-DM 6.25-15 MG/5ML PO SYRP: 5.0000 mL | ORAL_SOLUTION | Freq: Four times a day (QID) | ORAL | 0 refills | Status: DC | PRN

## 2022-06-26 MED ORDER — OLOPATADINE HCL 0.2 % OP SOLN: 1.0000 [drp] | Freq: Every day | OPHTHALMIC | 0 refills | Status: DC

## 2022-06-26 MED ORDER — ALBUTEROL SULFATE HFA 108 (90 BASE) MCG/ACT IN AERS: 1.0000 | INHALATION_SPRAY | RESPIRATORY_TRACT | 0 refills | Status: DC | PRN

## 2022-06-26 MED ORDER — AEROCHAMBER MV MISC: 1 refills | Status: DC

## 2022-06-26 NOTE — Discharge Instructions (Addendum)
We will contact you if your COVID comes back positive.  You will need to stay out of work if it is positive.  In the meantime, 2 puffs from your albuterol inhaler using your spacer every 4 hours for 2 days, then every 6 hours for 2 days, then as needed.  You can back on the albuterol if you start to improve sooner.  Promethazine DM for cough.  Atrovent nasal spray, saline nasal irrigation with a Milta Deiters Med rinse and distilled water as often as you want help prevent a sinus infection.  Mucinex D will also help with the nasal congestion.  Stop the allergy pill.  Pataday will help with your eyes.

## 2022-06-26 NOTE — ED Triage Notes (Signed)
Patient having cough and congestion for 3 days. Patient having productive cough and wheezing. Also having chest discomfort. No history of respiratory issues. No known sick exposure and negative home COVID-19 test.  Patient does work for the school system.

## 2022-06-26 NOTE — ED Provider Notes (Signed)
HPI  SUBJECTIVE:  Leah Lucas is a 56 y.o. female who presents with 4 days of nasal congestion, clear rhinorrhea, dry cough and wheezing.  She had a negative home COVID test 2 nights ago.  She is unable to sleep at night secondary to the cough.  No fevers, body aches, headaches, postnasal drip, sore throat, sinus pain or pressure, facial swelling, upper dental pain, shortness of breath, nausea, vomiting, diarrhea, abdominal pain.  She reports temporary loss of sense of smell and taste, but this has returned.  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  She got 2 doses of the COVID-vaccine.  No antibiotics in the past month.  No antipyretic in the past 6 hours.  She tried allergy medication which is no longer working for her, chest congestion medication without improvement in her symptoms.  No aggravating factors.  She has a past medical history of allergies and COVID in 2019.  PCP: In [place].     Past Medical History:  Diagnosis Date   Hypercholesteremia    Seasonal allergies     Past Surgical History:  Procedure Laterality Date   COLONOSCOPY N/A 07/26/2020   Procedure: COLONOSCOPY;  Surgeon: Rogene Houston, MD;  [facility];  Service: Endoscopy;  Laterality: N/A;  1030   DILATATION AND CURETTAGE/HYSTEROSCOPY WITH MINERVA N/A 01/23/2021   Procedure: DILATATION AND CURETTAGE/HYSTEROSCOPY WITH MINERVA ENDOMETRIAL ABLATION;  Surgeon: Florian Buff, MD;  Location: [facility];  Service: Gynecology;  Laterality: N/A;   HERNIA REPAIR     VENTRAL HERNIA REPAIR N/A 12/28/2019   Procedure: HERNIA REPAIR VENTRAL ADULT WITH MESH;  Surgeon: Aviva Signs, MD;  Location: [facility];  Service: General;  Laterality: N/A;    Family History  Problem Relation Age of Onset   Diabetes Mother    Heart disease Mother    Hypertension Mother    Congestive Heart Failure Mother    Early death Father    Hypertension Sister    Hypertension Brother    Heart attack Brother     Hypertension Maternal Grandmother    Diabetes Maternal Grandmother    Hypertension Maternal Grandfather    Hypertension Sister    Hypertension Brother    Diabetes Brother    Heart attack Sister    Heart disease Sister    Congestive Heart Failure Sister    Diabetes Sister    Hypertension Sister    Colon cancer Sister     Social History   Tobacco Use   Smoking status: Never   Smokeless tobacco: Never  Vaping Use   Vaping Use: Never used  Substance Use Topics   Alcohol use: Yes    Comment: occ.   Drug use: No    No current facility-administered medications for this encounter.  Current Outpatient Medications:    albuterol (VENTOLIN HFA) 108 (90 Base) MCG/ACT inhaler, Inhale 1-2 puffs into the lungs every 4 (four) hours as needed for wheezing or shortness of breath., Disp: 1 each, Rfl: 0   Ascorbic Acid (VITAMIN C PO), Take 1 tablet by mouth daily., Disp: , Rfl:    Cholecalciferol (VITAMIN D3 PO), Take 1 tablet by mouth daily., Disp: , Rfl:    ipratropium (ATROVENT) 0.06 % nasal spray, Place 2 sprays into both nostrils 4 (four) times daily., Disp: 15 mL, Rfl: 0   nystatin-triamcinolone (MYCOLOG II) cream, Apply 1 application topically 2 (two) times daily., Disp: 30 g, Rfl: 0   Olopatadine HCl 0.2 % SOLN, Apply 1 drop  to eye daily., Disp: 2.5 mL, Rfl: 0   promethazine-dextromethorphan (PROMETHAZINE-DM) 6.25-15 MG/5ML syrup, Take 5 mLs by mouth 4 (four) times daily as needed for cough., Disp: 118 mL, Rfl: 0   Spacer/Aero-Holding Chambers (AEROCHAMBER MV) inhaler, Use as instructed, Disp: 1 each, Rfl: 1   VITAMIN A PO, Take 1 tablet by mouth daily., Disp: , Rfl:    fluticasone (FLONASE) 50 MCG/ACT nasal spray, Place 1 spray into both nostrils daily. (Patient not taking: No sig reported), Disp: 16 g, Rfl: 0   pravastatin (PRAVACHOL) 20 MG tablet, Take 20 mg by mouth daily., Disp: , Rfl:   No Known Allergies   ROS  As noted in HPI.   Physical Exam  BP (!) 147/98 (BP Location:  Left Arm)   Pulse 73   Temp 98.8 F (37.1 C) (Oral)   Resp 16   SpO2 100%   Constitutional: Well developed, well nourished, no acute distress Eyes:  EOMI, conjunctiva normal bilaterally HENT: Normocephalic, atraumatic,mucus membranes moist positive nasal congestion.  Erythematous, swollen turbinates.  No maxillary cough.  Sinus tenderness.  No postnasal drip.  Positive cobblestoning. Neck: No cervical lymphadenopathy. Respiratory: Normal inspiratory effort, lungs clear bilaterally.  No anterior, lateral chest wall tenderness Cardiovascular: Normal rate, regular rhythm, no murmurs rubs or gallops. GI: nondistended skin: No rash, skin intact Musculoskeletal: no deformities Neurologic: Alert & oriented x 3, no focal neuro deficits Psychiatric: Speech and behavior appropriate   ED Course   Medications - No data to display  Orders Placed This Encounter  Procedures   SARS Coronavirus 2 by RT PCR (hospital order, performed in [place] hospital lab) *cepheid single result test* Anterior Nasal Swab    Standing Status:   Standing    Number of Occurrences:   1    Order Specific Question:   Release to patient    Answer:   Immediate    No results found for this or any previous visit (from the past 24 hour(s)). No results found.  ED Clinical Impression  1. Viral URI with cough   2. Encounter for laboratory testing for COVID-19 virus      ED Assessment/Plan      Presentation consistent with an upper respiratory infection.  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  She declined a prescription of antivirals if COVID is positive.  Did not check for flu because she is out of the treatment window.  Phone number [phone].  She also has MyChart.  In the meantime, with supportive treatment.  Regularly scheduled albuterol inhaler with a spacer, Atrovent nasal spray, saline nasal irrigation, Promethazine DM.  Pataday as she is reporting red, burning eyes.  She  declined a work note for tomorrow.  Discussed labs,MDM, treatment plan, and plan for follow-up with Discussed sn/sx that should prompt return to the ED. patient agrees with plan.   Meds ordered this encounter  Medications   ipratropium (ATROVENT) 0.06 % nasal spray    Sig: Place 2 sprays into both nostrils 4 (four) times daily.    Dispense:  15 mL    Refill:  0   promethazine-dextromethorphan (PROMETHAZINE-DM) 6.25-15 MG/5ML syrup    Sig: Take 5 mLs by mouth 4 (four) times daily as needed for cough.    Dispense:  118 mL    Refill:  0   Olopatadine HCl 0.2 % SOLN    Sig: Apply 1 drop to eye daily.    Dispense:  2.5 mL  Refill:  0   Spacer/Aero-Holding Chambers (AEROCHAMBER MV) inhaler    Sig: Use as instructed    Dispense:  1 each    Refill:  1   albuterol (VENTOLIN HFA) 108 (90 Base) MCG/ACT inhaler    Sig: Inhale 1-2 puffs into the lungs every 4 (four) hours as needed for wheezing or shortness of breath.    Dispense:  1 each    Refill:  0      *This clinic note was created using [employer]. Therefore, there may be occasional mistakes despite careful proofreading.  ?    Melynda Ripple, MD 06/26/22 1705

## 2022-07-17 ENCOUNTER — Encounter: Payer: Self-pay | Admitting: Adult Health

## 2022-07-17 ENCOUNTER — Ambulatory Visit (INDEPENDENT_AMBULATORY_CARE_PROVIDER_SITE_OTHER): Payer: 59 | Admitting: Adult Health

## 2022-07-17 ENCOUNTER — Other Ambulatory Visit (HOSPITAL_COMMUNITY)
Admission: RE | Admit: 2022-07-17 | Discharge: 2022-07-17 | Disposition: A | Discharge status: Home / Self Care | Payer: 59 | Source: Ambulatory Visit | Attending: Adult Health | Admitting: Adult Health

## 2022-07-17 VITALS — BP 157/92 | HR 75 | Ht 65.0 in | Wt 203.0 lb

## 2022-07-17 DIAGNOSIS — Z01419 Encounter for gynecological examination (general) (routine) without abnormal findings: Secondary | ICD-10-CM

## 2022-07-17 DIAGNOSIS — I10 Essential (primary) hypertension: Secondary | ICD-10-CM

## 2022-07-17 DIAGNOSIS — Z9889 Other specified postprocedural states: Secondary | ICD-10-CM | POA: Diagnosis not present

## 2022-07-17 DIAGNOSIS — Z1211 Encounter for screening for malignant neoplasm of colon: Secondary | ICD-10-CM | POA: Insufficient documentation

## 2022-07-17 DIAGNOSIS — Z780 Asymptomatic menopausal state: Secondary | ICD-10-CM | POA: Diagnosis not present

## 2022-07-17 LAB — HEMOCCULT GUIAC POC 1CARD (OFFICE): Fecal Occult Blood, POC: NEGATIVE

## 2022-07-17 MED ORDER — HYDROCHLOROTHIAZIDE 12.5 MG PO CAPS: 12.5000 mg | ORAL_CAPSULE | Freq: Every day | ORAL | 6 refills | Status: DC

## 2022-07-17 NOTE — Progress Notes (Signed)
Patient ID: Leah Lucas, female   DOB: [birth year], 56 y.o.   MRN: [id number] History of Present Illness: Leah Lucas is a 56 year old black female,single, PM, sp ablation, in for well woman gyn exam and pap.  She is looking for PCP.   Current Medications, Allergies, Past Medical History, Past Surgical History, Family History and Social History were reviewed in [employer] record.     Review of Systems: Patient denies any headaches, hearing loss, fatigue, blurred vision, shortness of breath, chest pain, abdominal pain, problems with bowel movements, urination, or intercourse. No joint pain or mood swings.  Denies any bleeding   Physical Exam:BP (!) 157/92 (BP Location: Left Arm, Patient Position: Sitting, Cuff Size: Normal)   Pulse 75   Ht '5\' 5"'$  (1.651 m)   Wt 203 lb (92.1 kg)   BMI 33.78 kg/m   General:  Well developed, well nourished, no acute distress Skin:  Warm and dry Neck:  Midline trachea, normal thyroid, good ROM, no lymphadenopathy Lungs; Clear to auscultation bilaterally Breast:  No dominant palpable mass, retraction, or nipple discharge Cardiovascular: Regular rate and rhythm Abdomen:  Soft, non tender, no hepatosplenomegaly Pelvic:  External genitalia is normal in appearance, no lesions.  The vagina is normal in appearance. Urethra has no lesions or masses. The cervix is smooth, pap with GC/CHL and HR HPV genotyping performed.  Uterus is felt to be normal size, shape, and contour.  No adnexal masses or tenderness noted.Bladder is non tender, no masses felt. Rectal: Good sphincter tone, no polyps, or hemorrhoids felt.  Hemoccult negative. Extremities/musculoskeletal:  No swelling or varicosities noted, no clubbing or cyanosis Psych:  No mood changes, alert and cooperative,seems happy AA is 2 Fall risk is low    07/17/2022    3:42 PM 08/03/2020   10:43 AM 06/14/2020   11:20 AM  Depression screen PHQ 2/9  Decreased Interest 1 0 0  Down,  Depressed, Hopeless 0 0 0  PHQ - 2 Score 1 0 0  Altered sleeping 0  0  Tired, decreased energy 0  0  Change in appetite 0  0  Feeling bad or failure about yourself  0  0  Trouble concentrating 0  0  Moving slowly or fidgety/restless 0  0  Suicidal thoughts 0  0  PHQ-9 Score 1  0  Difficult doing work/chores   Not difficult at all       07/17/2022    3:43 PM 06/14/2020   11:20 AM  GAD 7 : Generalized Anxiety Score  Nervous, Anxious, on Edge 0 0  Control/stop worrying 0 1  Worry too much - different things 0 1  Trouble relaxing 0 1  Restless 0 0  Easily annoyed or irritable 0 0  Afraid - awful might happen 0 1  Total GAD 7 Score 0 4  Anxiety Difficulty  Not difficult at all      Upstream - 07/17/22 1548       Pregnancy Intention Screening   Does the patient want to become pregnant in the next year? No    Does the patient's partner want to become pregnant in the next year? No    Would the patient like to discuss contraceptive options today? No      Contraception Wrap Up   Current Method No Method - Other Reason   ablation/// PM   End Method No Method - Other Reason   ablation///PM   Contraception Counseling Provided No  Impression and Plan: 1. Encounter for gynecological examination with Papanicolaou smear of cervix Pap sent  Pap in 3 years if normal Physical in 1 year Mammogram was negative 12/30/21 Get PCP  2. Encounter for screening fecal occult blood testing Hemoccult is negative   3. S/P endometrial ablation  4. Hypertension, unspecified type Try to lose 10 lbs Decrease salt and sugars Will rx Microzide Meds ordered this encounter  Medications   hydrochlorothiazide (MICROZIDE) 12.5 MG capsule    Sig: Take 1 capsule (12.5 mg total) by mouth daily.    Dispense:  30 capsule    Refill:  6    Order Specific Question:   Supervising Provider    Answer:   Elonda Husky, LUTHER H [[id number]]   Recheck BP in 8 weeks  Keep check at home   5.  Postmenopause Denies any bleeding

## 2022-07-22 LAB — CYTOLOGY - PAP
Chlamydia: NEGATIVE
Comment: NEGATIVE
Comment: NEGATIVE
Comment: NORMAL
Diagnosis: UNDETERMINED — AB
High risk HPV: NEGATIVE
Neisseria Gonorrhea: NEGATIVE

## 2022-07-23 ENCOUNTER — Encounter: Payer: Self-pay | Admitting: Adult Health

## 2022-07-23 DIAGNOSIS — R87610 Atypical squamous cells of undetermined significance on cytologic smear of cervix (ASC-US): Secondary | ICD-10-CM

## 2022-07-23 HISTORY — DX: Atypical squamous cells of undetermined significance on cytologic smear of cervix (ASC-US): R87.610

## 2022-08-20 ENCOUNTER — Ambulatory Visit: Payer: 59 | Admitting: Podiatry

## 2022-08-20 DIAGNOSIS — R7989 Other specified abnormal findings of blood chemistry: Secondary | ICD-10-CM

## 2022-08-20 DIAGNOSIS — B351 Tinea unguium: Secondary | ICD-10-CM

## 2022-08-20 LAB — HEPATIC FUNCTION PANEL
AG Ratio: 1.4 (calc) (ref 1.0–2.5)
ALT: 11 U/L (ref 6–29)
AST: 13 U/L (ref 10–35)
Albumin: 4.4 g/dL (ref 3.6–5.1)
Alkaline phosphatase (APISO): 99 U/L (ref 37–153)
Bilirubin, Direct: 0.1 mg/dL (ref 0.0–0.2)
Globulin: 3.2 g/dL (calc) (ref 1.9–3.7)
Indirect Bilirubin: 0.5 mg/dL (calc) (ref 0.2–1.2)
Total Bilirubin: 0.6 mg/dL (ref 0.2–1.2)
Total Protein: 7.6 g/dL (ref 6.1–8.1)

## 2022-08-20 MED ORDER — TERBINAFINE HCL 250 MG PO TABS: 250.0000 mg | ORAL_TABLET | Freq: Every day | ORAL | 0 refills | Status: DC

## 2022-08-21 NOTE — Progress Notes (Signed)
Subjective:   Patient ID: Leah Lucas, female   DOB: 56 y.o.   MRN: [id number]   HPI Patient presents with a several year history of discoloration to the distal portion of the right big toenail stating that she had tried topical which did not seem to make a big difference and she is motivated to try to do something to get this better.  Patient does not smoke and is active   Review of Systems  All other systems reviewed and are negative.       Objective:  Physical Exam Vitals and nursing note reviewed.  Constitutional:      Appearance: She is well-developed.  Pulmonary:     Effort: Pulmonary effort is normal.  Musculoskeletal:        General: Normal range of motion.  Skin:    General: Skin is warm.  Neurological:     Mental Status: She is alert.     Neurovascular status found to be intact muscle strength found to be adequate range of motion adequate with a thickened dystrophic distal one third of the right hallux nail bed nonpainful that is been present for a while     Assessment:  Appears to be more of a fungal element due to the 2-year history versus traumatic but there is probably a component of both     Plan:  H&P education rendered discussed and at this point I have recommended oral medication as her best aggressive chance to get this better and patient wants to follow this approach.  I did explain this approach to the patient and the fact it may or may not solve her problem and she wants to pursue and we will do liver function studies and she is given a requisition sheet for liver function study we will get that done today and I did go ahead and I wrote her prescription for Lamisil for 90 days I explained the risk of Lamisil and she will start it if any issues were to occur let us know and it can take 6 to 9 months to completely do what it supposed to do

## 2022-09-10 ENCOUNTER — Encounter: Payer: Self-pay | Admitting: Adult Health

## 2022-09-10 ENCOUNTER — Ambulatory Visit: Payer: 59 | Admitting: Adult Health

## 2022-09-10 VITALS — BP 136/81 | HR 73 | Ht 65.0 in | Wt 204.0 lb

## 2022-09-10 DIAGNOSIS — I10 Essential (primary) hypertension: Secondary | ICD-10-CM | POA: Diagnosis not present

## 2022-09-10 MED ORDER — HYDROCHLOROTHIAZIDE 12.5 MG PO CAPS: 12.5000 mg | ORAL_CAPSULE | Freq: Every day | ORAL | 6 refills | Status: DC

## 2022-09-10 NOTE — Progress Notes (Signed)
  Subjective:     Patient ID: Leah Lucas, female   DOB: 1966/02/09, 56 y.o.   MRN: [id number]  HPI Leah Lucas is a 56 year old black female,single, PM in for BP check, she is on Microzide.  Last pap was ASCUS, negative HPV 07/17/22,repeat in 3 years   Review of Systems Has some cramps in hands at times Denies headaches  Reviewed past medical,surgical, social and family history. Reviewed medications and allergies.     Objective:   Physical Exam BP 136/81 (BP Location: Left Arm, Patient Position: Sitting, Cuff Size: Normal)   Pulse 73   Ht '5\' 5"'$  (1.651 m)   Wt 204 lb (92.5 kg)   BMI 33.95 kg/m     Skin warm and dry.  Lungs: clear to ausculation bilaterally. Cardiovascular: regular rate and rhythm.   Upstream - 09/10/22 1636       Pregnancy Intention Screening   Does the patient want to become pregnant in the next year? No    Would the patient like to discuss contraceptive options today? No      Contraception Wrap Up   Current Method No Method - Other Reason   PM   Reason for No Current Contraceptive Method at Intake ([facility] Only) Other   PM   End Method No Method - Other Reason   PM   Contraception Counseling Provided No             Assessment:     1. Hypertension, unspecified type Continue Microzide and check BP at home Meds ordered this encounter  Medications   hydrochlorothiazide (MICROZIDE) 12.5 MG capsule    Sig: Take 1 capsule (12.5 mg total) by mouth daily.    Dispense:  30 capsule    Refill:  6    Order Specific Question:   Supervising Provider    Answer:   Florian Buff [[id number]]       Plan:     Follow up in 3 months

## 2022-09-11 ENCOUNTER — Ambulatory Visit: Payer: 59 | Admitting: Adult Health

## 2022-09-19 ENCOUNTER — Ambulatory Visit (HOSPITAL_COMMUNITY)
Admission: EM | Admit: 2022-09-19 | Discharge: 2022-09-19 | Disposition: A | Discharge status: Home / Self Care | Payer: 59 | Attending: Internal Medicine | Admitting: Internal Medicine

## 2022-09-19 ENCOUNTER — Encounter (HOSPITAL_COMMUNITY): Payer: Self-pay

## 2022-09-19 DIAGNOSIS — U071 COVID-19: Secondary | ICD-10-CM | POA: Insufficient documentation

## 2022-09-19 DIAGNOSIS — B349 Viral infection, unspecified: Secondary | ICD-10-CM | POA: Diagnosis not present

## 2022-09-19 LAB — RESP PANEL BY RT-PCR (FLU A&B, COVID) ARPGX2
Influenza A by PCR: NEGATIVE
Influenza B by PCR: NEGATIVE
SARS Coronavirus 2 by RT PCR: POSITIVE — AB

## 2022-09-19 MED ORDER — ONDANSETRON 4 MG PO TBDP
4.0000 mg | ORAL_TABLET | Freq: Once | ORAL | Status: AC
  Administered 2022-09-19: 4 mg via ORAL

## 2022-09-19 MED ORDER — ONDANSETRON HCL 4 MG PO TABS: 4.0000 mg | ORAL_TABLET | Freq: Four times a day (QID) | ORAL | 0 refills | Status: DC

## 2022-09-19 MED ORDER — PROMETHAZINE-DM 6.25-15 MG/5ML PO SYRP: 5.0000 mL | ORAL_SOLUTION | Freq: Every evening | ORAL | 0 refills | Status: DC

## 2022-09-19 MED ORDER — ACETAMINOPHEN 325 MG PO TABS
ORAL_TABLET | ORAL | Status: AC
  Filled 2022-09-19: qty 2

## 2022-09-19 MED ORDER — ACETAMINOPHEN 325 MG PO TABS
650.0000 mg | ORAL_TABLET | Freq: Once | ORAL | Status: AC
  Administered 2022-09-19: 650 mg via ORAL

## 2022-09-19 MED ORDER — BENZONATATE 100 MG PO CAPS: 100.0000 mg | ORAL_CAPSULE | Freq: Three times a day (TID) | ORAL | 0 refills | Status: DC

## 2022-09-19 MED ORDER — ONDANSETRON 4 MG PO TBDP
ORAL_TABLET | ORAL | Status: AC
  Filled 2022-09-19: qty 1

## 2022-09-19 NOTE — ED Provider Notes (Signed)
[place]    CSN: [id number] Arrival date & time: 09/19/22  1523      History   Chief Complaint Chief Complaint  Patient presents with   Nausea   Headache    HPI Leah Lucas is a 56 y.o. female.  Patient presents complaining of nausea and headache started today.  Patient reports 1 episode of emesis that occurred today.  Patient reports loose stools but denies any watery stool.  Patient reports upon waking she experienced some drainage within her throat and nonproductive cough. Patient states symptoms worsened while at work today and she stated that " I didn't feel good and had to go home". Patient reports that she has not taken any medicines for her symptoms.  She denies any known exposure to any viral illness.    Headache Associated symptoms: cough, fever (Subjective fever), nausea and vomiting   Associated symptoms: no abdominal pain, no congestion, no diarrhea, no dizziness, no drainage, no ear pain, no fatigue, no sinus pressure and no sore throat     Past Medical History:  Diagnosis Date   ASCUS of cervix with negative high risk HPV 07/23/2022   07/23/22 Repeat pap in 3 years per ASCCP guidelines, 5 year risk of CIN 3+ is 0.40%   Hypercholesteremia    Seasonal allergies     Patient Active Problem List   Diagnosis Date Noted   ASCUS of cervix with negative high risk HPV 07/23/2022   Encounter for screening fecal occult blood testing 07/17/2022   Encounter for gynecological examination with Papanicolaou smear of cervix 07/17/2022   Postmenopause 07/17/2022   Hypertension 07/17/2022   S/P endometrial ablation 07/17/2022   Menorrhagia with irregular cycle    Dysmenorrhea    DUB (dysfunctional uterine bleeding) 01/07/2021   Allergies 01/07/2021   Encounter for menstrual regulation 11/20/2020   Screening cholesterol level 11/20/2020   Fibroids 08/03/2020   Vaginal bleeding 07/12/2020   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    Screening examination for STD (sexually transmitted disease) 06/14/2020   PMB (postmenopausal bleeding) 06/14/2020   Vaginal discharge 06/14/2020   Ventral hernia without obstruction or gangrene    Amenorrhea 05/18/2014    Past Surgical History:  Procedure Laterality Date   COLONOSCOPY N/A 07/26/2020   Procedure: COLONOSCOPY;  Surgeon: Rogene Houston, MD;  Location: [facility];  Service: Endoscopy;  Laterality: N/A;  1030   DILATATION AND CURETTAGE/HYSTEROSCOPY WITH MINERVA N/A 01/23/2021   Procedure: DILATATION AND CURETTAGE/HYSTEROSCOPY WITH MINERVA ENDOMETRIAL ABLATION;  Surgeon: Florian Buff, MD;  Location: [facility];  Service: Gynecology;  Laterality: N/A;   HERNIA REPAIR     VENTRAL HERNIA REPAIR N/A 12/28/2019   Procedure: HERNIA REPAIR VENTRAL ADULT WITH MESH;  Surgeon: Aviva Signs, MD;  Location: [facility];  Service: General;  Laterality: N/A;    OB History     Gravida  1   Para  1   Term  1   Preterm  0   AB  0   Living  1      SAB  0   IAB  0   Ectopic  0   Multiple      Live Births  1            Home Medications    Prior to Admission medications   Medication Sig Start Date End Date Taking? Authorizing Provider  Ascorbic Acid (VITAMIN C PO) Take 1 tablet by mouth daily.    [provider]  benzonatate (TESSALON) 100 MG capsule Take 1 capsule (100 mg total) by mouth every 8 (eight) hours. 09/19/22  Yes Flossie Dibble, NP  Cholecalciferol (VITAMIN D3 PO) Take 1 tablet by mouth daily.    [provider]  hydrochlorothiazide (MICROZIDE) 12.5 MG capsule Take 1 capsule (12.5 mg total) by mouth daily. 09/10/22   Estill Dooms, NP  Multiple Vitamin (MULTIVITAMIN) tablet Take 1 tablet by mouth daily. 1 gummy daily    [provider]  ondansetron (ZOFRAN) 4 MG tablet Take 1 tablet (4 mg total) by mouth every 6 (six) hours. 09/19/22  Yes Flossie Dibble, NP  promethazine-dextromethorphan (PROMETHAZINE-DM) 6.25-15  MG/5ML syrup Take 5 mLs by mouth at bedtime. 09/19/22  Yes Flossie Dibble, NP  VITAMIN A PO Take 1 tablet by mouth daily.    [provider]    Family History Family History  Problem Relation Age of Onset   Diabetes Mother    Heart disease Mother    Hypertension Mother    Congestive Heart Failure Mother    Early death Father    Hypertension Sister    Hypertension Brother    Heart attack Brother    Hypertension Maternal Grandmother    Diabetes Maternal Grandmother    Hypertension Maternal Grandfather    Hypertension Sister    Hypertension Brother    Diabetes Brother    Heart attack Sister    Heart disease Sister    Congestive Heart Failure Sister    Diabetes Sister    Hypertension Sister    Colon cancer Sister     Social History Social History   Tobacco Use   Smoking status: Never   Smokeless tobacco: Never  Vaping Use   Vaping Use: Never used  Substance Use Topics   Alcohol use: Yes    Comment: occ.   Drug use: No     Allergies   Patient has no known allergies.   Review of Systems Review of Systems  Constitutional:  Positive for activity change and fever (Subjective fever). Negative for appetite change, chills and fatigue.  HENT:  Positive for rhinorrhea. Negative for congestion, ear discharge, ear pain, postnasal drip, sinus pressure, sinus pain, sneezing, sore throat, tinnitus and trouble swallowing.   Eyes: Negative.   Respiratory:  Positive for cough. Negative for apnea, chest tightness, shortness of breath, wheezing and stridor.   Cardiovascular:  Negative for chest pain and palpitations.  Gastrointestinal:  Positive for nausea and vomiting. Negative for abdominal distention, abdominal pain, constipation and diarrhea.  Neurological:  Positive for headaches. Negative for dizziness.     Physical Exam Triage Vital Signs ED Triage Vitals  Enc Vitals Group     BP 09/19/22 1637 131/77     Pulse Rate 09/19/22 1637 92     Resp 09/19/22 1637  14     Temp 09/19/22 1637 98.8 F (37.1 C)     Temp Source 09/19/22 1637 Oral     SpO2 09/19/22 1637 99 %     Weight --      Height --      Head Circumference --      Peak Flow --      Pain Score 09/19/22 1638 6     Pain Loc --      Pain Edu? --      Excl. in [place]? --    No data found.  Updated Vital Signs BP 131/77 (BP Location: Left Arm)   Pulse 92   Temp 98.8  F (37.1 C) (Oral)   Resp 14   SpO2 99%    Physical Exam Vitals and nursing note reviewed.  Constitutional:      Appearance: She is well-developed.  HENT:     Right Ear: Hearing, tympanic membrane, ear canal and external ear normal.     Left Ear: Hearing, tympanic membrane, ear canal and external ear normal.     Nose: Rhinorrhea present. No congestion. Rhinorrhea is clear.     Right Turbinates: Not enlarged, swollen or pale.     Left Turbinates: Not enlarged, swollen or pale.     Right Sinus: No maxillary sinus tenderness or frontal sinus tenderness.     Left Sinus: No maxillary sinus tenderness or frontal sinus tenderness.     Mouth/Throat:     Mouth: Mucous membranes are moist.     Pharynx: Oropharynx is clear. Uvula midline. No pharyngeal swelling, oropharyngeal exudate, posterior oropharyngeal erythema or uvula swelling.     Tonsils: No tonsillar exudate or tonsillar abscesses. 0 on the right. 0 on the left.  Cardiovascular:     Rate and Rhythm: Normal rate and regular rhythm.     Heart sounds: Normal heart sounds, S1 normal and S2 normal.  Pulmonary:     Effort: Pulmonary effort is normal.     Breath sounds: Normal breath sounds. No decreased breath sounds, wheezing, rhonchi or rales.  Lymphadenopathy:     Head:     Right side of head: No tonsillar adenopathy.     Left side of head: No tonsillar adenopathy.     Cervical: Cervical adenopathy present.     Right cervical: Deep cervical adenopathy present. No superficial or posterior cervical adenopathy.    Left cervical: Deep cervical adenopathy present. No  superficial or posterior cervical adenopathy.  Neurological:     Mental Status: She is alert.      UC Treatments / Results  Labs (all labs ordered are listed, but only abnormal results are displayed) Labs Reviewed  RESP PANEL BY RT-PCR (FLU A&B, COVID) [id number]    EKG   Radiology No results found.  Procedures Procedures (including critical care time)  Medications Ordered in UC Medications  ondansetron (ZOFRAN-ODT) disintegrating tablet 4 mg (4 mg Oral Given 09/19/22 1642)  acetaminophen (TYLENOL) tablet 650 mg (650 mg Oral Given 09/19/22 1642)    Initial Impression / Assessment and Plan / UC Course  I have reviewed the triage vital signs and the nursing notes.  Pertinent labs & imaging results that were available during my care of the patient were reviewed by me and considered in my medical decision making (see chart for details).     Patient was evaluated for viral illness. COVID and Flu is pending. Flu antiviral candidate.  Tylenol and Zofran was given in office, patient experienced some relief of symptoms.  Patient was made aware of symptom management of a viral illness.  Zofran, Promethazine DM, and Tessalon was sent to the pharmacy to help with symptom management.  Patient was made aware of safety precautions with Promethazine DM. Patient made aware of timeline for symptom resolution and when follow-up would be necessary.  Patient made aware of results reporting protocol and MyChart.  Patient verbalized understanding of instructions.    Charting was provided using a a verbal dictation system, charting was proofread for errors, errors may occur which could change the meaning of the information charted.   Final Clinical Impressions(s) / UC Diagnoses   Final diagnoses:  Viral illness  Discharge Instructions      We will call you if any of your test results warrant a change in your plan of care.   Tessalon has been sent to the pharmacy for cough, you can use this  medication every 8 hours as needed. Promethazine DM has been sent to the pharmacy, you can use this medication before bedtime, please do not operate any heavy machinery or drive a car after taking this medication.  Zofran has been sent to the pharmacy for nausea, you can take this every 6 hours as needed for nausea.   Viral illnesses usually takes 7 to 10 days to resolve.  Using over-the-counter medications can help treat the symptoms.  You may rotate Tylenol and ibuprofen every 4-6 hours.  For example, take Tylenol now and then 4 to 6 hours later take ibuprofen.  You can use Tylenol for fever and moderate pain, you can take this medication every 4-6 hours, please do not take more than 3000 mg in a 24-hour day.  You can take ibuprofen every 6 hours, do not take more than 2400 mg in a 24-hour day.  I advised that you do not take ibuprofen on an empty stomach, ibuprofen can cause GI problems such as GI bleeding.  Maintaining hydration status is very important, please drink at least 8 cups of water daily.  Please try to intake nutrient dense meals.        ED Prescriptions     Medication Sig Dispense Auth. Provider   ondansetron (ZOFRAN) 4 MG tablet Take 1 tablet (4 mg total) by mouth every 6 (six) hours. 28 tablet Flossie Dibble, NP   promethazine-dextromethorphan (PROMETHAZINE-DM) 6.25-15 MG/5ML syrup Take 5 mLs by mouth at bedtime. 118 mL Flossie Dibble, NP   benzonatate (TESSALON) 100 MG capsule Take 1 capsule (100 mg total) by mouth every 8 (eight) hours. 24 capsule Flossie Dibble, NP      PDMP not reviewed this encounter.   Flossie Dibble, NP 09/19/22 1828

## 2022-09-19 NOTE — ED Triage Notes (Signed)
Pt presents with c/o HA, n/v that began today

## 2022-09-19 NOTE — Discharge Instructions (Addendum)
We will call you if any of your test results warrant a change in your plan of care.   Tessalon has been sent to the pharmacy for cough, you can use this medication every 8 hours as needed. Promethazine DM has been sent to the pharmacy, you can use this medication before bedtime, please do not operate any heavy machinery or drive a car after taking this medication.  Zofran has been sent to the pharmacy for nausea, you can take this every 6 hours as needed for nausea.   Viral illnesses usually takes 7 to 10 days to resolve.  Using over-the-counter medications can help treat the symptoms.  You may rotate Tylenol and ibuprofen every 4-6 hours.  For example, take Tylenol now and then 4 to 6 hours later take ibuprofen.  You can use Tylenol for fever and moderate pain, you can take this medication every 4-6 hours, please do not take more than 3000 mg in a 24-hour day.  You can take ibuprofen every 6 hours, do not take more than 2400 mg in a 24-hour day.  I advised that you do not take ibuprofen on an empty stomach, ibuprofen can cause GI problems such as GI bleeding.  Maintaining hydration status is very important, please drink at least 8 cups of water daily.  Please try to intake nutrient dense meals.

## 2022-09-20 ENCOUNTER — Telehealth (HOSPITAL_COMMUNITY): Payer: Self-pay | Admitting: Emergency Medicine

## 2022-09-20 MED ORDER — MOLNUPIRAVIR EUA 200MG CAPSULE: 4.0000 | ORAL_CAPSULE | Freq: Two times a day (BID) | ORAL | 0 refills | Status: DC

## 2022-09-20 MED ORDER — MOLNUPIRAVIR EUA 200MG CAPSULE: 4.0000 | ORAL_CAPSULE | Freq: Two times a day (BID) | ORAL | 0 refills | Status: AC

## 2022-09-20 NOTE — Telephone Encounter (Signed)
Patient was called and patient's identity was verified.  Patient was made aware of her COVID test results.  Patient was made aware that an antiviral treatment was being sent to the pharmacy.  Patient was made aware of treatment plan and questions were answered.  Patient verbalized understanding of instructions.

## 2022-12-10 ENCOUNTER — Ambulatory Visit: Payer: Commercial Managed Care - HMO | Admitting: Adult Health

## 2023-03-27 ENCOUNTER — Other Ambulatory Visit: Payer: Self-pay | Admitting: Adult Health

## 2023-07-10 ENCOUNTER — Ambulatory Visit (HOSPITAL_COMMUNITY)
Admission: EM | Admit: 2023-07-10 | Discharge: 2023-07-10 | Disposition: A | Discharge status: Home / Self Care | Payer: 59 | Attending: Internal Medicine | Admitting: Internal Medicine

## 2023-07-10 ENCOUNTER — Encounter (HOSPITAL_COMMUNITY): Payer: Self-pay | Admitting: Emergency Medicine

## 2023-07-10 DIAGNOSIS — J069 Acute upper respiratory infection, unspecified: Secondary | ICD-10-CM | POA: Diagnosis not present

## 2023-07-10 DIAGNOSIS — Z1152 Encounter for screening for COVID-19: Secondary | ICD-10-CM | POA: Insufficient documentation

## 2023-07-10 MED ORDER — AMOXICILLIN-POT CLAVULANATE 875-125 MG PO TABS: 1.0000 | ORAL_TABLET | Freq: Two times a day (BID) | ORAL | 0 refills | Status: DC

## 2023-07-10 NOTE — Discharge Instructions (Addendum)
Coricidin HBP as directed on the package Flonase nasal spray as directed on the package Saline nasal rinses  COVID test results will be available tomorrow, we will contact you if your test is positive.  If your sinus symptoms worsen, you develop fever symptoms last greater than 10 days can fill the paper prescription as written

## 2023-07-10 NOTE — ED Triage Notes (Signed)
Pt c/o congestion all week. Reports last night had hard time breathing due to congestion.

## 2023-07-10 NOTE — ED Provider Notes (Signed)
[facility]    CSN: [id number] Arrival date & time: 07/10/23  1436      History   Chief Complaint Chief Complaint  Patient presents with   Nasal Congestion    HPI Leah Lucas is a 57 y.o. female.   HPI Nasal congestion for 5 days associated with rhinorrhea and postnasal drip.  Symptoms worsening over the last 2 days now has yellow mucus drainage. Admits cough her throat.  Denies fever, chills, sweats, chest pain, shortness of breath, nausea, vomiting, diarrhea, ear pain, headache, dizziness.  She works in an [employer], [employer], teaches music.  Admits work contacts with illness. States she would like a COVID test. Over-the-counter medications tried.  Drinking ginger tea with honey.  Past Medical History:  Diagnosis Date   ASCUS of cervix with negative high risk HPV 07/23/2022   07/23/22 Repeat pap in 3 years per ASCCP guidelines, 5 year risk of CIN 3+ is 0.40%   Hypercholesteremia    Seasonal allergies     Patient Active Problem List   Diagnosis Date Noted   ASCUS of cervix with negative high risk HPV 07/23/2022   Encounter for screening fecal occult blood testing 07/17/2022   Encounter for gynecological examination with Papanicolaou smear of cervix 07/17/2022   Postmenopause 07/17/2022   Hypertension 07/17/2022   S/P endometrial ablation 07/17/2022   Menorrhagia with irregular cycle    Dysmenorrhea    DUB (dysfunctional uterine bleeding) 01/07/2021   Allergies 01/07/2021   Encounter for menstrual regulation 11/20/2020   Screening cholesterol level 11/20/2020   Fibroids 08/03/2020   Vaginal bleeding 07/12/2020   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   Screening examination for STD (sexually transmitted disease) 06/14/2020   PMB (postmenopausal bleeding) 06/14/2020   Vaginal discharge 06/14/2020   Ventral hernia without obstruction or gangrene    Amenorrhea 05/18/2014    Past Surgical History:  Procedure Laterality Date    COLONOSCOPY N/A 07/26/2020   Procedure: COLONOSCOPY;  Surgeon: Malissa Hippo, MD;  Location: [facility];  Service: Endoscopy;  Laterality: N/A;  1030   DILATATION AND CURETTAGE/HYSTEROSCOPY WITH MINERVA N/A 01/23/2021   Procedure: DILATATION AND CURETTAGE/HYSTEROSCOPY WITH MINERVA ENDOMETRIAL ABLATION;  Surgeon: Lazaro Arms, MD;  Location: [facility];  Service: Gynecology;  Laterality: N/A;   HERNIA REPAIR     VENTRAL HERNIA REPAIR N/A 12/28/2019   Procedure: HERNIA REPAIR VENTRAL ADULT WITH MESH;  Surgeon: Franky Macho, MD;  Location: [facility];  Service: General;  Laterality: N/A;    OB History     Gravida  1   Para  1   Term  1   Preterm  0   AB  0   Living  1      SAB  0   IAB  0   Ectopic  0   Multiple      Live Births  1            Home Medications    Prior to Admission medications   Medication Sig Start Date End Date Taking? Authorizing Provider  Ascorbic Acid (VITAMIN C PO) Take 1 tablet by mouth daily.    [provider]  benzonatate (TESSALON) 100 MG capsule Take 1 capsule (100 mg total) by mouth every 8 (eight) hours. 09/19/22   Debby Freiberg, NP  Cholecalciferol (VITAMIN D3 PO) Take 1 tablet by mouth daily.    [provider]  hydrochlorothiazide (MICROZIDE) 12.5 MG capsule TAKE 1 CAPSULE BY MOUTH EVERY DAY 03/30/23  Cyril Mourning A, NP  Multiple Vitamin (MULTIVITAMIN) tablet Take 1 tablet by mouth daily. 1 gummy daily    [provider]  ondansetron (ZOFRAN) 4 MG tablet Take 1 tablet (4 mg total) by mouth every 6 (six) hours. 09/19/22   Debby Freiberg, NP  promethazine-dextromethorphan (PROMETHAZINE-DM) 6.25-15 MG/5ML syrup Take 5 mLs by mouth at bedtime. 09/19/22   Debby Freiberg, NP  VITAMIN A PO Take 1 tablet by mouth daily.    [provider]    Family History Family History  Problem Relation Age of Onset   Diabetes Mother    Heart disease Mother    Hypertension Mother    Congestive  Heart Failure Mother    Early death Father    Hypertension Sister    Hypertension Brother    Heart attack Brother    Hypertension Maternal Grandmother    Diabetes Maternal Grandmother    Hypertension Maternal Grandfather    Hypertension Sister    Hypertension Brother    Diabetes Brother    Heart attack Sister    Heart disease Sister    Congestive Heart Failure Sister    Diabetes Sister    Hypertension Sister    Colon cancer Sister     Social History Social History   Tobacco Use   Smoking status: Never   Smokeless tobacco: Never  Vaping Use   Vaping status: Never Used  Substance Use Topics   Alcohol use: Yes    Comment: occ.   Drug use: No     Allergies   Patient has no known allergies.   Review of Systems Review of Systems  Constitutional:  Negative for appetite change, chills, fatigue and fever.  HENT:  Positive for postnasal drip and rhinorrhea. Negative for ear discharge, sinus pressure, sinus pain, sore throat and trouble swallowing.   Respiratory:  Positive for cough. Negative for shortness of breath.   Gastrointestinal:  Negative for diarrhea, nausea and vomiting.  Neurological:  Negative for headaches.     Physical Exam Triage Vital Signs ED Triage Vitals  Encounter Vitals Group     BP 07/10/23 1542 (!) 143/95     Systolic BP Percentile --      Diastolic BP Percentile --      Pulse Rate 07/10/23 1542 69     Resp 07/10/23 1542 17     Temp 07/10/23 1542 98.3 F (36.8 C)     Temp Source 07/10/23 1542 Oral     SpO2 07/10/23 1542 97 %     Weight --      Height --      Head Circumference --      Peak Flow --      Pain Score 07/10/23 1540 0     Pain Loc --      Pain Education --      Exclude from Growth Chart --    No data found.  Updated Vital Signs BP (!) 143/95 (BP Location: Right Arm)   Pulse 69   Temp 98.3 F (36.8 C) (Oral)   Resp 17   SpO2 97%   Visual Acuity Right Eye Distance:   Left Eye Distance:   Bilateral Distance:     Right Eye Near:   Left Eye Near:    Bilateral Near:     Physical Exam Vitals and nursing note reviewed.  Constitutional:      Appearance: She is not ill-appearing.  HENT:     Head: Normocephalic and atraumatic.  Right Ear: Tympanic membrane and ear canal normal.     Left Ear: Tympanic membrane and ear canal normal.     Nose: Congestion and rhinorrhea present.     Right Sinus: No maxillary sinus tenderness or frontal sinus tenderness.     Left Sinus: No maxillary sinus tenderness or frontal sinus tenderness.  Cardiovascular:     Rate and Rhythm: Normal rate.     Heart sounds: Normal heart sounds.  Pulmonary:     Effort: Pulmonary effort is normal. No respiratory distress.  Musculoskeletal:     Cervical back: Neck supple.  Lymphadenopathy:     Cervical: No cervical adenopathy.  Neurological:     Mental Status: She is alert.      UC Treatments / Results  Labs (all labs ordered are listed, but only abnormal results are displayed) Labs Reviewed - No data to display  EKG   Radiology No results found.  Procedures Procedures (including critical care time)  Medications Ordered in UC Medications - No data to display  Initial Impression / Assessment and Plan / UC Course  I have reviewed the triage vital signs and the nursing notes.  Pertinent labs & imaging results that were available during my care of the patient were reviewed by me and considered in my medical decision making (see chart for details).     57 year old past medical history of hypertension presents with nasal congestion, postnasal drip, yellow nasal drainage 5 days.  Admits work contacts with illness. No fever, no sinus tenderness, no evidence of ear sinus or throat bacterial infections.  Lungs are clear.  Recommend Flonase and saline rinses.  If symptoms last greater than 10 days or development of fever can fill paper prescription.  Patient requesting COVID test, PCR ordered Final Clinical  Impressions(s) / UC Diagnoses   Final diagnoses:  None   Discharge Instructions   None    ED Prescriptions   None    PDMP not reviewed this encounter.   Meliton Rattan, [place] 07/10/23 [phone]

## 2023-07-11 LAB — SARS CORONAVIRUS 2 (TAT 6-24 HRS): SARS Coronavirus 2: NEGATIVE

## 2023-08-14 DIAGNOSIS — Z008 Encounter for other general examination: Secondary | ICD-10-CM | POA: Diagnosis not present

## 2023-08-20 ENCOUNTER — Ambulatory Visit (INDEPENDENT_AMBULATORY_CARE_PROVIDER_SITE_OTHER): Payer: 59 | Admitting: Adult Health

## 2023-08-20 ENCOUNTER — Encounter: Payer: Self-pay | Admitting: Adult Health

## 2023-08-20 ENCOUNTER — Other Ambulatory Visit (HOSPITAL_COMMUNITY)
Admission: RE | Admit: 2023-08-20 | Discharge: 2023-08-20 | Disposition: A | Discharge status: Home / Self Care | Payer: 59 | Source: Ambulatory Visit | Attending: Adult Health | Admitting: Adult Health

## 2023-08-20 VITALS — BP 139/87 | HR 62 | Ht 65.0 in | Wt 210.0 lb

## 2023-08-20 DIAGNOSIS — Z13220 Encounter for screening for lipoid disorders: Secondary | ICD-10-CM

## 2023-08-20 DIAGNOSIS — Z113 Encounter for screening for infections with a predominantly sexual mode of transmission: Secondary | ICD-10-CM | POA: Diagnosis not present

## 2023-08-20 DIAGNOSIS — Z1331 Encounter for screening for depression: Secondary | ICD-10-CM | POA: Diagnosis not present

## 2023-08-20 DIAGNOSIS — Z01419 Encounter for gynecological examination (general) (routine) without abnormal findings: Secondary | ICD-10-CM | POA: Diagnosis not present

## 2023-08-20 DIAGNOSIS — I10 Essential (primary) hypertension: Secondary | ICD-10-CM

## 2023-08-20 DIAGNOSIS — Z1211 Encounter for screening for malignant neoplasm of colon: Secondary | ICD-10-CM | POA: Diagnosis not present

## 2023-08-20 DIAGNOSIS — Z780 Asymptomatic menopausal state: Secondary | ICD-10-CM

## 2023-08-20 DIAGNOSIS — Z1231 Encounter for screening mammogram for malignant neoplasm of breast: Secondary | ICD-10-CM | POA: Diagnosis not present

## 2023-08-20 LAB — HEMOCCULT GUIAC POC 1CARD (OFFICE): Fecal Occult Blood, POC: NEGATIVE

## 2023-08-20 MED ORDER — HYDROCHLOROTHIAZIDE 12.5 MG PO CAPS: 12.5000 mg | ORAL_CAPSULE | Freq: Every day | ORAL | 12 refills | Status: DC

## 2023-08-20 NOTE — Progress Notes (Signed)
Patient ID: Leah Lucas, female   DOB: 1966-07-19, 57 y.o.   MRN: [id number] History of Present Illness: Leah Lucas is a 57 year old black female, single, PM in for a well woman gyn exam. She is taking on line college classes, and is sub teacher in [place]      Component Value Date/Time   DIAGPAP (A) 07/17/2022 1550    - Atypical squamous cells of undetermined significance (ASC-US)   HPVHIGH Negative 07/17/2022 1550   ADEQPAP  07/17/2022 1550    Satisfactory for evaluation; transformation zone component PRESENT.     Current Medications, Allergies, Past Medical History, Past Surgical History, Family History and Social History were reviewed in [employer] record.     Review of Systems: Patient denies any headaches, hearing loss, fatigue, blurred vision, shortness of breath, chest pain, abdominal pain, problems with bowel movements, urination, or intercourse(last sex 10/06/22). No joint pain or mood swings.  Denies any vaginal bleeding    Physical Exam:BP 139/87 (BP Location: Left Arm, Patient Position: Sitting, Cuff Size: Normal) Comment (BP Location): forearm  Pulse 62   Ht 5\' 5"  (1.651 m)   Wt 210 lb (95.3 kg)   LMP  (LMP Unknown) Comment: no periods after ablation  BMI 34.95 kg/m   General:  Well developed, well nourished, no acute distress Skin:  Warm and dry Neck:  Midline trachea, normal thyroid, good ROM, no lymphadenopathy Lungs; Clear to auscultation bilaterally Breast:  No dominant palpable mass, retraction, or nipple discharge Cardiovascular: Regular rate and rhythm Abdomen:  Soft, non tender, no hepatosplenomegaly Pelvic:  External genitalia is normal in appearance, no lesions.  The vagina is normal in appearance, creamy discharge, without odor. Urethra has no lesions or masses. The cervix is smooth.  Uterus is felt to be normal size, shape, and contour.  No adnexal masses or tenderness noted.Bladder is non tender, no masses felt. Rectal:  Good sphincter tone, no polyps, or hemorrhoids felt.  Hemoccult negative. Extremities/musculoskeletal:  No swelling or varicosities noted, no clubbing or cyanosis Psych:  No mood changes, alert and cooperative,seems happy AA is 2 Fall risk is low    08/20/2023    3:22 PM 07/17/2022    3:42 PM 08/03/2020   10:43 AM  Depression screen PHQ 2/9  Decreased Interest 0 1 0  Down, Depressed, Hopeless 0 0 0  PHQ - 2 Score 0 1 0  Altered sleeping 0 0   Tired, decreased energy 0 0   Change in appetite 0 0   Feeling bad or failure about yourself  0 0   Trouble concentrating 0 0   Moving slowly or fidgety/restless 0 0   Suicidal thoughts 0 0   PHQ-9 Score 0 1        08/20/2023    3:22 PM 07/17/2022    3:43 PM 06/14/2020   11:20 AM  GAD 7 : Generalized Anxiety Score  Nervous, Anxious, on Edge 1 0 0  Control/stop worrying 1 0 1  Worry too much - different things 1 0 1  Trouble relaxing 0 0 1  Restless 0 0 0  Easily annoyed or irritable 1 0 0  Afraid - awful might happen 1 0 1  Total GAD 7 Score 5 0 4  Anxiety Difficulty   Not difficult at all    Upstream - 08/20/23 1528       Pregnancy Intention Screening   Does the patient want to become pregnant in the next year? No  Does the patient's partner want to become pregnant in the next year? No    Would the patient like to discuss contraceptive options today? No      Contraception Wrap Up   Current Method No Method - Other Reason   Ablation, postmenopausal   Reason for No Current Contraceptive Method at Intake ([facility] Only) Other   ablation, postmenopausal   End Method No Method - Other Reason   Ablation, postmenopausal   Contraception Counseling Provided No            Examination chaperoned by Freddie Apley RN     Impression and Plan: 1. Encounter for well woman exam with routine gynecological exam Physical in 1 year Pap in 2026 Will check labs  - CBC - Comprehensive metabolic panel - Lipid panel  2. Encounter for  screening fecal occult blood testing Hemoccult was negative  - POCT occult blood stool  3. Postmenopause Denies any vaginal bleeding   4. Hypertension, unspecified type Continue Microzide Meds ordered this encounter  Medications   hydrochlorothiazide (MICROZIDE) 12.5 MG capsule    Sig: Take 1 capsule (12.5 mg total) by mouth daily.    Dispense:  30 capsule    Refill:  12    Order Specific Question:   Supervising Provider    Answer:   Duane Lope H [[id number]]   Decrease salt and crabs, eat more fresh raw veggies   5. Screening cholesterol level - Lipid panel  6. Screening examination for STD (sexually transmitted disease) CV swab sent for GC/CHL,trich,BV and yeast  Will check HIV and RPR - Cervicovaginal ancillary only( [place]) - HIV Antibody (routine testing w rflx) - RPR  7. Screening mammogram for breast cancer Pt to call for mammogram  - MM 3D SCREENING MAMMOGRAM BILATERAL BREAST; Future

## 2023-08-21 LAB — CBC
Hematocrit: 42 % (ref 34.0–46.6)
Hemoglobin: 13.3 g/dL (ref 11.1–15.9)
MCH: 26.8 pg (ref 26.6–33.0)
MCHC: 31.7 g/dL (ref 31.5–35.7)
MCV: 85 fL (ref 79–97)
Platelets: 393 10*3/uL (ref 150–450)
RBC: 4.96 x10E6/uL (ref 3.77–5.28)
RDW: 13.1 % (ref 11.7–15.4)
WBC: 6.2 10*3/uL (ref 3.4–10.8)

## 2023-08-21 LAB — LIPID PANEL
Chol/HDL Ratio: 4.3 ratio (ref 0.0–4.4)
Cholesterol, Total: 267 mg/dL — ABNORMAL HIGH (ref 100–199)
HDL: 62 mg/dL (ref >39)
LDL Chol Calc (NIH): 179 mg/dL — ABNORMAL HIGH (ref 0–99)
Triglycerides: 142 mg/dL (ref 0–149)
VLDL Cholesterol Cal: 26 mg/dL (ref 5–40)

## 2023-08-21 LAB — COMPREHENSIVE METABOLIC PANEL
ALT: 13 [IU]/L (ref 0–32)
AST: 18 [IU]/L (ref 0–40)
Albumin: 4.4 g/dL (ref 3.8–4.9)
Alkaline Phosphatase: 111 [IU]/L (ref 44–121)
BUN/Creatinine Ratio: 15 (ref 9–23)
BUN: 10 mg/dL (ref 6–24)
Bilirubin Total: 0.4 mg/dL (ref 0.0–1.2)
CO2: 25 mmol/L (ref 20–29)
Calcium: 10 mg/dL (ref 8.7–10.2)
Chloride: 97 mmol/L (ref 96–106)
Creatinine, Ser: 0.67 mg/dL (ref 0.57–1.00)
Globulin, Total: 3 g/dL (ref 1.5–4.5)
Glucose: 88 mg/dL (ref 70–99)
Potassium: 4.2 mmol/L (ref 3.5–5.2)
Sodium: 139 mmol/L (ref 134–144)
Total Protein: 7.4 g/dL (ref 6.0–8.5)
eGFR: 103 mL/min/{1.73_m2} (ref >59)

## 2023-08-21 LAB — HIV ANTIBODY (ROUTINE TESTING W REFLEX): HIV Screen 4th Generation wRfx: NONREACTIVE

## 2023-08-21 LAB — RPR: RPR Ser Ql: NONREACTIVE

## 2023-08-24 LAB — CERVICOVAGINAL ANCILLARY ONLY
Bacterial Vaginitis (gardnerella): NEGATIVE
Candida Glabrata: NEGATIVE
Candida Vaginitis: NEGATIVE
Chlamydia: NEGATIVE
Comment: NEGATIVE
Comment: NEGATIVE
Comment: NEGATIVE
Comment: NEGATIVE
Comment: NEGATIVE
Comment: NORMAL
Neisseria Gonorrhea: NEGATIVE
Trichomonas: NEGATIVE

## 2024-08-04 ENCOUNTER — Other Ambulatory Visit: Payer: Self-pay | Admitting: Adult Health

## 2024-08-15 ENCOUNTER — Encounter: Payer: Self-pay | Admitting: Adult Health

## 2024-08-17 ENCOUNTER — Ambulatory Visit (HOSPITAL_COMMUNITY)
Admission: RE | Admit: 2024-08-17 | Discharge: 2024-08-17 | Disposition: A | Discharge status: Home / Self Care | Payer: Self-pay | Source: Ambulatory Visit | Attending: Adult Health | Admitting: Adult Health

## 2024-08-17 ENCOUNTER — Encounter (HOSPITAL_COMMUNITY): Payer: Self-pay

## 2024-08-17 DIAGNOSIS — Z1231 Encounter for screening mammogram for malignant neoplasm of breast: Secondary | ICD-10-CM | POA: Insufficient documentation

## 2024-08-22 ENCOUNTER — Ambulatory Visit: Payer: Self-pay | Admitting: Adult Health

## 2024-09-21 ENCOUNTER — Ambulatory Visit: Payer: Self-pay | Admitting: Advanced Practice Midwife

## 2024-09-21 ENCOUNTER — Encounter: Payer: Self-pay | Admitting: Advanced Practice Midwife

## 2024-09-21 VITALS — BP 132/84 | Ht 65.0 in | Wt 212.0 lb

## 2024-09-21 DIAGNOSIS — Z01419 Encounter for gynecological examination (general) (routine) without abnormal findings: Secondary | ICD-10-CM

## 2024-09-21 DIAGNOSIS — Z139 Encounter for screening, unspecified: Secondary | ICD-10-CM

## 2024-09-21 DIAGNOSIS — Z113 Encounter for screening for infections with a predominantly sexual mode of transmission: Secondary | ICD-10-CM

## 2024-09-21 DIAGNOSIS — I10 Essential (primary) hypertension: Secondary | ICD-10-CM

## 2024-09-21 NOTE — Progress Notes (Signed)
 WELL-WOMAN EXAMINATION Patient name: Leah Lucas MRN [id number]  Date of birth: 15-Nov-1965 Chief Complaint:   Annual Exam  History of Present Illness:   Leah Lucas is a 58 y.o. G6P1001 African-American female being seen today for a routine well-woman exam.  Current complaints: doing well; reports vag dryness and requesting topical tx; no concerns with sex; denies vag bleeding  PCP: none      does desire labs including STD screening No LMP recorded. Patient has had an ablation. The current method of family planning is post menopausal status.  Last pap Oct 2023. Results were: ASCUS w/ HRHPV negative. H/O abnormal pap: yes Last mammogram: Nov 2025. Results were: normal. Family h/o breast cancer: no Last colonoscopy: April 2021. Results were: abnormal GI doc wants repeat in 5 years. Family h/o colorectal cancer: yes , sister     09/21/2024    8:44 AM 08/20/2023    3:22 PM 07/17/2022    3:42 PM 08/03/2020   10:43 AM 06/14/2020   11:20 AM  Depression screen PHQ 2/9  Decreased Interest 3 0 1 0 0  Down, Depressed, Hopeless 0 0 0 0 0  PHQ - 2 Score 3 0 1 0 0  Altered sleeping 0 0 0  0  Tired, decreased energy 0 0 0  0  Change in appetite 0 0 0  0  Feeling bad or failure about yourself  1 0 0  0  Trouble concentrating 0 0 0  0  Moving slowly or fidgety/restless 0 0 0  0  Suicidal thoughts 0 0 0  0  PHQ-9 Score 4 0  1   0   Difficult doing work/chores     Not difficult at all     Data saved with a previous flowsheet row definition        09/21/2024    8:44 AM 08/20/2023    3:22 PM 07/17/2022    3:43 PM 06/14/2020   11:20 AM  GAD 7 : Generalized Anxiety Score  Nervous, Anxious, on Edge 0 1 0 0  Control/stop worrying 0 1 0 1  Worry too much - different things 1 1 0 1  Trouble relaxing 0 0 0 1  Restless 0 0 0 0  Easily annoyed or irritable 0 1 0 0  Afraid - awful might happen 0 1 0 1  Total GAD 7 Score 1 5 0 4  Anxiety Difficulty    Not difficult at all      Review of Systems:   Pertinent items are noted in HPI Denies any headaches, blurred vision, fatigue, shortness of breath, chest pain, abdominal pain, abnormal vaginal discharge/itching/odor/irritation, problems with periods, bowel movements, urination, or intercourse unless otherwise stated above. Pertinent History Reviewed:  Reviewed past medical,surgical, social and family history.  Reviewed problem list, medications and allergies. Physical Assessment:   Vitals:   09/21/24 0835  BP: 132/84  Weight: 212 lb (96.2 kg)  Height: 5' 5 (1.651 m)  Body mass index is 35.28 kg/m.        Physical Examination:   General appearance - well appearing, and in no distress  Mental status - alert, oriented to person, place, and time  Psych:  She has a normal mood and affect  Skin - warm and dry, normal color, no suspicious lesions noted  Chest - effort normal, all lung fields clear to auscultation bilaterally  Heart - normal rate and regular rhythm  Neck:  not examined  Breasts - not examined; no concerns  Abdomen - soft, nontender, nondistended, no masses or organomegaly  Pelvic - VULVA: normal appearing vulva with no masses, tenderness or lesions  VAGINA: normal appearing vagina with normal color and discharge, no lesions  CERVIX: normal appearing cervix without discharge or lesions, no CMT  Thin prep pap is not done   UTERUS: uterus is felt to be normal size, shape, consistency and nontender   ADNEXA: No adnexal masses or tenderness noted.  Rectal - not examined  Extremities:  No swelling or varicosities noted  Chaperone: Aleck Blase  No results found for this or any previous visit (from the past 24 hours).  Assessment & Plan:  1) Well-Woman Exam; labs today (CBC, CMET, lipids, STD screening)  2) Cervical cancer screening, Pap due in 2026  3) Postmenopausal, no vag bleeding; reports dryness; rx Estrace nightly x 2 weeks then 2x/week  4) cHTN, continue  Microzide   Labs/procedures today: labs  Mammogram: in 1 year, or sooner if problems Colonoscopy: per GI, or sooner if problems  Orders Placed This Encounter  Procedures   CBC   Comp Met (CMET)   Lipid Panel With LDL/HDL Ratio   HIV antibody (with reflex)   RPR    Meds:  Meds ordered this encounter  Medications   estradiol (ESTRACE) 0.01 % CREA vaginal cream    Sig: Place 1 Applicatorful vaginally at bedtime. Nightly x 2 weeks, then twice weekly    Dispense:  42.5 g    Refill:  12    Supervising Provider:   MARILYNN NEST [[id number]]    Follow-up: Return for Nov 2026 pap physical w [facility].  Leah Lucas CNM 09/21/2024 9:16 AM

## 2024-09-22 LAB — LIPID PANEL WITH LDL/HDL RATIO
Cholesterol, Total: 248 mg/dL — ABNORMAL HIGH (ref 100–199)
HDL: 65 mg/dL (ref >39)
LDL Chol Calc (NIH): 169 mg/dL — ABNORMAL HIGH (ref 0–99)
LDL/HDL Ratio: 2.6 ratio (ref 0.0–3.2)
Triglycerides: 81 mg/dL (ref 0–149)
VLDL Cholesterol Cal: 14 mg/dL (ref 5–40)

## 2024-09-22 LAB — CBC
Hematocrit: 42.3 % (ref 34.0–46.6)
Hemoglobin: 13.5 g/dL (ref 11.1–15.9)
MCH: 26.7 pg (ref 26.6–33.0)
MCHC: 31.9 g/dL (ref 31.5–35.7)
MCV: 84 fL (ref 79–97)
Platelets: 434 x10E3/uL (ref 150–450)
RBC: 5.05 x10E6/uL (ref 3.77–5.28)
RDW: 13.1 % (ref 11.7–15.4)
WBC: 5.1 x10E3/uL (ref 3.4–10.8)

## 2024-09-22 LAB — COMPREHENSIVE METABOLIC PANEL WITH GFR
ALT: 12 IU/L (ref 0–32)
AST: 15 IU/L (ref 0–40)
Albumin: 4.4 g/dL (ref 3.8–4.9)
Alkaline Phosphatase: 100 IU/L (ref 49–135)
BUN/Creatinine Ratio: 12 (ref 9–23)
BUN: 10 mg/dL (ref 6–24)
Bilirubin Total: 0.7 mg/dL (ref 0.0–1.2)
CO2: 26 mmol/L (ref 20–29)
Calcium: 10.3 mg/dL — ABNORMAL HIGH (ref 8.7–10.2)
Chloride: 100 mmol/L (ref 96–106)
Creatinine, Ser: 0.83 mg/dL (ref 0.57–1.00)
Globulin, Total: 3.3 g/dL (ref 1.5–4.5)
Glucose: 107 mg/dL — ABNORMAL HIGH (ref 70–99)
Potassium: 4.1 mmol/L (ref 3.5–5.2)
Sodium: 139 mmol/L (ref 134–144)
Total Protein: 7.7 g/dL (ref 6.0–8.5)
eGFR: 83 mL/min/1.73 (ref >59)

## 2024-09-22 LAB — HIV ANTIBODY (ROUTINE TESTING W REFLEX): HIV Screen 4th Generation wRfx: NONREACTIVE

## 2024-09-22 LAB — SYPHILIS: RPR W/REFLEX TO RPR TITER AND TREPONEMAL ANTIBODIES, TRADITIONAL SCREENING AND DIAGNOSIS ALGORITHM: RPR Ser Ql: NONREACTIVE

## 2024-09-25 LAB — GC/CHLAMYDIA PROBE AMP
Chlamydia trachomatis, NAA: NEGATIVE
Neisseria Gonorrhoeae by PCR: NEGATIVE

## 2024-09-30 ENCOUNTER — Ambulatory Visit: Payer: Self-pay | Admitting: Advanced Practice Midwife
